# Patient Record
Sex: Female | Born: 1980
Health system: Southern US, Community
[De-identification: ages and names within clinical notes are randomized; demographics above are authoritative.]

## PROBLEM LIST (undated history)

## (undated) DIAGNOSIS — K219 Gastro-esophageal reflux disease without esophagitis: Secondary | ICD-10-CM

## (undated) DIAGNOSIS — F329 Major depressive disorder, single episode, unspecified: Secondary | ICD-10-CM

## (undated) DIAGNOSIS — F32A Depression, unspecified: Secondary | ICD-10-CM

## (undated) DIAGNOSIS — F419 Anxiety disorder, unspecified: Secondary | ICD-10-CM

## (undated) DIAGNOSIS — D6852 Prothrombin gene mutation: Secondary | ICD-10-CM

## (undated) DIAGNOSIS — K8020 Calculus of gallbladder without cholecystitis without obstruction: Secondary | ICD-10-CM

## (undated) DIAGNOSIS — D682 Hereditary deficiency of other clotting factors: Secondary | ICD-10-CM

## (undated) DIAGNOSIS — Z7901 Long term (current) use of anticoagulants: Secondary | ICD-10-CM

## (undated) HISTORY — DX: Calculus of gallbladder without cholecystitis without obstruction: K80.20

## (undated) HISTORY — DX: Anxiety disorder, unspecified: F41.9

## (undated) HISTORY — DX: Depression, unspecified: F32.A

---
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
## 2009-05-08 HISTORY — PX: LAPAROSCOPIC GASTRIC BANDING: SHX1100

## 2012-05-08 HISTORY — PX: OTHER SURGICAL HISTORY: SHX169

## 2017-05-08 HISTORY — PX: OTHER SURGICAL HISTORY: SHX169

## 2018-05-08 NOTE — L&D Delivery Note (Addendum)
Delivery Note At  a non-viable female infant was delivered over intact perineum via svd (Presentation: cephalic, en call  ).  APGAR: 0,0 ; weight not yet done  .   Placenta status: delivered en call, grossly wnl.  Cord: appeared wnl,  with the following complications: none. Fetus and placenta examined and no gross abnormalities noted.  No cord accident appreciated.  Anesthesia:  epidural Episiotomy:  none Lacerations:  none Suture Repair: n/a Est. Blood Loss (mL):  179ml  Mom to postpartum.  Patient would like autopsy, baby currently in the room.  Microarray testing also requested and cord sample cut.  Patient has h/o pp depression and we will start zoloft - has been on in past and has done well.  Patient is requesting pastoral care at this time. Husband and patient coping well.  Charyl Bigger 10/21/2018, 7:08 AM

## 2018-07-17 ENCOUNTER — Other Ambulatory Visit: Payer: Self-pay | Admitting: Surgery

## 2018-07-26 ENCOUNTER — Encounter (HOSPITAL_COMMUNITY): Payer: Self-pay | Admitting: *Deleted

## 2018-07-26 NOTE — Patient Instructions (Addendum)
Cindy Krueger  07/26/2018       Your procedure is scheduled on:  07-30-2018   Report to [facility] Main  Entrance,  Report to Short Stay at  5:30 AM    Call this number if you have problems the morning of surgery [phone]        Remember: Do not eat food or drink liquids :After Midnight.  This includes no water, candy, gum, mints  BRUSH YOUR TEETH MORNING OF SURGERY AND RINSE YOUR MOUTH OUT         Take these medicines the morning of surgery with A SIP OF WATER:  NONE                                    You may not have any metal on your body including hair pins and piercings              Do not wear jewelry, make-up, lotions, powders or perfumes, deodorant                          Men may shave face and neck.       Do not bring valuables to the hospital. [facility] IS NOT             RESPONSIBLE   FOR VALUABLES.  Contacts, dentures or bridgework may not be worn into surgery.          Patients discharged the day of surgery will not be allowed to drive home. IF YOU ARE HAVING SURGERY AND GOING HOME THE SAME DAY, YOU MUST HAVE AN ADULT TO DRIVE YOU HOME AND BE WITH YOU FOR 24 HOURS. YOU MAY GO HOME BY TAXI OR UBER OR ORTHERWISE, BUT AN ADULT MUST ACCOMPANY YOU HOME AND STAY WITH YOU FOR 24 HOURS.   Name and phone number of your driver:         _____________________________________________________________________             [facility] - Preparing for Surgery Before surgery, you can play an important role.  Because skin is not sterile, your skin needs to be as free of germs as possible.  You can reduce the number of germs on your skin by washing with CHG (chlorahexidine gluconate) soap before surgery.  CHG is an antiseptic cleaner which kills germs and bonds with the skin to continue killing germs even after washing. Please DO NOT use if you have an allergy to CHG or antibacterial soaps.  If your skin becomes  reddened/irritated stop using the CHG and inform your nurse when you arrive at Short Stay. Do not shave (including legs and underarms) for at least 48 hours prior to the first CHG shower.  You may shave your face/neck. Please follow these instructions carefully:  1.  Shower with CHG Soap the night before surgery and the  morning of Surgery.  2.  If you choose to wash your hair, wash your hair first as usual with your  normal  shampoo.  3.  After you shampoo, rinse your hair and body thoroughly to remove the  shampoo.                            4.  Use CHG as you  would any other liquid soap.  You can apply chg directly  to the skin and wash                       Gently with a scrungie or clean washcloth.  5.  Apply the CHG Soap to your body ONLY FROM THE NECK DOWN.   Do not use on face/ open                           Wound or open sores. Avoid contact with eyes, ears mouth and genitals (private parts).                       Wash face,  Genitals (private parts) with your normal soap.             6.  Wash thoroughly, paying special attention to the area where your surgery  will be performed.  7.  Thoroughly rinse your body with warm water from the neck down.  8.  DO NOT shower/wash with your normal soap after using and rinsing off  the CHG Soap.             9.  Pat yourself dry with a clean towel.            10.  Wear clean pajamas.            11.  Place clean sheets on your bed the night of your first shower and do not  sleep with pets. Day of Surgery : Do not apply any lotions/deodorants the morning of surgery.  Please wear clean clothes to the hospital/surgery center.  FAILURE TO FOLLOW THESE INSTRUCTIONS MAY RESULT IN THE CANCELLATION OF YOUR SURGERY PATIENT SIGNATURE_________________________________  NURSE SIGNATURE__________________________________  ________________________________________________________________________

## 2018-07-29 ENCOUNTER — Encounter (HOSPITAL_COMMUNITY)
Admission: RE | Admit: 2018-07-29 | Discharge: 2018-07-29 | Disposition: A | Discharge status: Home / Self Care | Payer: 59 | Source: Ambulatory Visit | Attending: Surgery | Admitting: Surgery

## 2018-07-29 ENCOUNTER — Other Ambulatory Visit: Payer: Self-pay

## 2018-07-29 ENCOUNTER — Encounter (HOSPITAL_COMMUNITY): Payer: Self-pay

## 2018-07-29 ENCOUNTER — Encounter (INDEPENDENT_AMBULATORY_CARE_PROVIDER_SITE_OTHER): Payer: Self-pay

## 2018-07-29 DIAGNOSIS — Z9884 Bariatric surgery status: Secondary | ICD-10-CM | POA: Diagnosis not present

## 2018-07-29 DIAGNOSIS — K9501 Infection due to gastric band procedure: Secondary | ICD-10-CM | POA: Insufficient documentation

## 2018-07-29 DIAGNOSIS — O26892 Other specified pregnancy related conditions, second trimester: Secondary | ICD-10-CM | POA: Diagnosis not present

## 2018-07-29 DIAGNOSIS — Z01812 Encounter for preprocedural laboratory examination: Secondary | ICD-10-CM | POA: Insufficient documentation

## 2018-07-29 DIAGNOSIS — Z7901 Long term (current) use of anticoagulants: Secondary | ICD-10-CM | POA: Diagnosis not present

## 2018-07-29 DIAGNOSIS — D6852 Prothrombin gene mutation: Secondary | ICD-10-CM | POA: Diagnosis not present

## 2018-07-29 DIAGNOSIS — Z87891 Personal history of nicotine dependence: Secondary | ICD-10-CM | POA: Diagnosis not present

## 2018-07-29 DIAGNOSIS — Z3A14 14 weeks gestation of pregnancy: Secondary | ICD-10-CM | POA: Diagnosis not present

## 2018-07-29 HISTORY — DX: Long term (current) use of anticoagulants: Z79.01

## 2018-07-29 HISTORY — DX: Gastro-esophageal reflux disease without esophagitis: K21.9

## 2018-07-29 HISTORY — DX: Hereditary deficiency of other clotting factors: D68.2

## 2018-07-29 HISTORY — DX: Prothrombin gene mutation: D68.52

## 2018-07-29 LAB — CBC
HCT: 36.2 % (ref 36.0–46.0)
Hemoglobin: 11.9 g/dL — ABNORMAL LOW (ref 12.0–15.0)
MCH: 31 pg (ref 26.0–34.0)
MCHC: 32.9 g/dL (ref 30.0–36.0)
MCV: 94.3 fL (ref 80.0–100.0)
Platelets: 223 10*3/uL (ref 150–400)
RBC: 3.84 MIL/uL — ABNORMAL LOW (ref 3.87–5.11)
RDW: 12.4 % (ref 11.5–15.5)
WBC: 9.8 10*3/uL (ref 4.0–10.5)
nRBC: 0 % (ref 0.0–0.2)

## 2018-07-29 LAB — BASIC METABOLIC PANEL
Anion gap: 11 (ref 5–15)
BUN: 14 mg/dL (ref 6–20)
CO2: 22 mmol/L (ref 22–32)
Calcium: 9.3 mg/dL (ref 8.9–10.3)
Chloride: 102 mmol/L (ref 98–111)
Creatinine, Ser: 0.64 mg/dL (ref 0.44–1.00)
GFR calc Af Amer: >60 mL/min (ref >60)
GFR calc non Af Amer: >60 mL/min (ref >60)
GLUCOSE: 93 mg/dL (ref 70–99)
Potassium: 3.9 mmol/L (ref 3.5–5.1)
Sodium: 135 mmol/L (ref 135–145)

## 2018-07-29 LAB — PROTIME-INR
INR: 1 (ref 0.8–1.2)
PROTHROMBIN TIME: 12.7 s (ref 11.4–15.2)

## 2018-07-29 LAB — APTT: aPTT: 32 seconds (ref 24–36)

## 2018-07-29 MED ORDER — CHLORHEXIDINE GLUCONATE CLOTH 2 % EX PADS
6.0000 | MEDICATED_PAD | Freq: Once | CUTANEOUS | Status: DC
  Filled 2018-07-29: qty 6

## 2018-07-29 NOTE — Progress Notes (Signed)
Chart to anesthesia for review, Jessica Zanetto PA. 

## 2018-07-29 NOTE — Anesthesia Preprocedure Evaluation (Addendum)
Anesthesia Evaluation  Patient identified by MRN, date of birth, ID band Patient awake    Reviewed: Allergy & Precautions, NPO status , Patient's Chart, lab work & pertinent test results  History of Anesthesia Complications Negative for: history of anesthetic complications  Airway Mallampati: II  TM Distance: >3 FB Neck ROM: Full    Dental  (+) Dental Advisory Given   Pulmonary former smoker (quit 1999),    breath sounds clear to auscultation       Cardiovascular negative cardio ROS   Rhythm:Regular Rate:Normal     Neuro/Psych negative neurological ROS     GI/Hepatic Neg liver ROS, GERD  Poorly Controlled,Malpositioned lap band   Endo/Other  obese  Renal/GU negative Renal ROS     Musculoskeletal   Abdominal (+) + obese,   Peds  Hematology Factor II deficiency: lovenox   Anesthesia Other Findings   Reproductive/Obstetrics (+) Pregnancy (due 01/25/2019 (14 weeks gest))                           Anesthesia Physical Anesthesia Plan  ASA: III  Anesthesia Plan: General   Post-op Pain Management:    Induction: Intravenous  PONV Risk Score and Plan: 3 and Treatment may vary due to age or medical condition, Ondansetron, Dexamethasone and Scopolamine patch - Pre-op  Airway Management Planned: Oral ETT  Additional Equipment:   Intra-op Plan:   Post-operative Plan: Extubation in OR  Informed Consent: I have reviewed the patients History and Physical, chart, labs and discussed the procedure including the risks, benefits and alternatives for the proposed anesthesia with the patient or authorized representative who has indicated his/her understanding and acceptance.     Dental advisory given  Plan Discussed with: CRNA and Surgeon  Anesthesia Plan Comments: (See PAT note 06/30/18, Jodell Cipro, PA-C Plan routine monitors, GETA OB not monitoring since only [redacted] weeks gestation,  non-viable)      Anesthesia Quick Evaluation

## 2018-07-29 NOTE — H&P (Signed)
Cindy Krueger  Location: [facility] Patient #: [id number] DOB: 1980-06-29 Married / Language: English / Race: White Female  History of Present Illness   The patient is a 38 year old female who presents with a complaint of lap band.  The PCP is Dr. Rhoderick Moody [facility])  The patient was referred by Dr. Rhoderick Moody  She comes by herself.  She had a lap band placed in [place] in 2009. Unfortunately, she required an open operation becasue a "blood vessel was cut". About 2012, she was living in [place], developed a SBO, and required another abdominal operation. She did well until 2019, when she was living in [place]. She had to have her port "revised". Dr. Era Bumpers, [facility] [place], did the surgery. She has never used the port. After port was placed - she went form 260 pounds to 170 pounds on her own. She even got to the place that she was doing sprint triathlons. But with her pregnancies, she has gained 40 pounds back. Over the last couple weeks she has had increasing tenderness and redness over her port.  I talked about the differential diagnosis, including port infection. I talked about possible erosion of the band. Talked about the medical and surgical options. For now we will try antibiotics, and see if the redness response. I think we'll schedule an upper endoscopy at some point to rule out band erosion. What to do with the port will depend on how she responds to antibiotics.  Plan: 1) Treat with Keflex for 2 weeks, 2) Possibly remove the port, 3) Consider upper endoscopy, 4) I spoke to French Ana, a nurse that works with Dr. Amado Nash, about the antibiotics. Dr. Amado Nash will call me next week.  Review of Systems as stated in this history (HPI) or in the review of systems. Otherwise all other 12 point ROS are negative  Past Medical History: 1. Lap band - 2009 [facility] Surgery was  done open - and she was so traumatized, she never had the band adjusted 2. SBO - 2012 - [facility] 3. Port revision [facility] - Dr. Era Bumpers She said that he did do one fill, but she never felt any resisitance. 4. PT factor 2 (Prothrombin 96295)- on Lovenox during her pregnancy She does not have a hematologist in [place] She is not on anticoagulation otherwise. She has not had DVT. She has had miscarriages. 5. Pregnant - due 01/25/2019  Social History: Married Has 2 yo daughter. She is pregnant and due 01/25/2019.  Her husband works Data processing manager for [employer]. 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 Medications Reconciled  Social History Cindy Krueger, [place]; 06/28/2018 3:15 PM) Alcohol use  Recently quit alcohol use. Caffeine use  Tea. No drug use   Family History Cindy Krueger, [place]; 06/28/2018 3:15 PM) Arthritis  Mother. Heart Disease  Father, Mother. Hypertension  Mother. Prostate Cancer  Father.  Pregnancy / Birth History Cindy Krueger, [place]; 06/28/2018 3:15 PM) Age at menarche  13 years. Contraceptive History  Oral contraceptives. Cindy Krueger  4 Length (months) of breastfeeding  7-12 Maternal age  15-35 Para  1  Other Problems Cindy Krueger, [place]; 06/28/2018 3:15 PM) Other disease, cancer, significant illness   Vitals Cindy Krueger CMA; 06/28/2018 3:17 PM) 06/28/2018 3:17 PM Weight: 215 lb Height:  67in Body Surface Area: 2.09 m Body Mass Index: 33.67 kg/m  Temp.: 98.69F  Pulse: 85 (Regular)  BP: 114/74 (Sitting, Left Arm, Standard)   Physical Exam   General: WN WF who is alert and generally healthy appearing. HEENT: Normal. Pupils equal.  Neck: Supple. No mass. No thyroid mass. Lymph Nodes: No supraclavicular or cervical nodes.  Lungs: Clear to auscultation and symmetric breath sounds. Heart: RRR. No murmur or rub.  Abdomen: Soft. No hernia. Normal bowel sounds.  She has a transverse incision in her upper abdomen, which is towards the left upper abdomen. Her port is in the midline, is at the skin, but has redness and tenderness. She has another incision above this that is transverse and maybe 7 cm. Rectal: Not done.  Extremities: Good strength and ROM in upper and lower extremities.  Neurologic: Grossly intact to motor and sensory function. Psychiatric: Has normal mood and affect. Behavior is normal.   Assessment & Plan  1.  HISTORY OF LAPAROSCOPIC ADJUSTABLE GASTRIC BANDING (Z98.84)  A.  Lap band - 2009 [facility]   Surgery was done open - and she was so traumatized, she never had the band adjusted  B.  SBO - 2012 - [facility]  C.  Port revision [facility] - Dr. Era Bumpers   She said that he did do one fill, but she never felt any resisitance.   Plan:  1)   This did not work  2) Remove the port  3) Upper endoscopy to evaluate possible erosion  2.  PREGNANT (Z34.90)  due 01/25/2019 3.  PROTHROMBIN G20210A MUTATION (Z61.09)  She does not have a hematologist in [place]  She has had miscarriages.  Impression: She is on Lovenox.  Addendum Note(Cindy Krueger H. Ezzard Standing MD; 07/03/2018 1:57 PM)  Talked on the phone to Dr. Martha Krueger. I explained lap bands and their risks. She is going to see Cindy Krueger on 3/10. Before any surgery or endo - she would prefer that Cindy Krueger is out of her 1st trimester.  So we could do something after 3/16, unless it  becomes surgically indicated before.  Addendum Note(Cindy Krueger H. Ezzard Standing MD; 07/17/2018 9:13 AM)  Talked to patient. The port got better briefly on antibiotics, but then got worse. She is ready to have port removed.  We talked about the options.  Plan upper endo and port removal at the same time. I don't need to see her in the office before the surgery.  Ovidio Kin, MD, [facility] Pager: [phone] Office phone:  [phone]

## 2018-07-29 NOTE — Progress Notes (Signed)
Anesthesia Chart Review   Case:  [id number] Date/Time:  07/30/18 0700   Procedure:  LAPAROSCOPIC REMOVAL OF Lap Band Port only, Upper Endo (N/A )   Anesthesia type:  General   Pre-op diagnosis:      PT factor 2  Prothrombin 60677 on Lovenox during her pregnancy,  H/O Lap Band Placement,     Infected and malpositioned Lap Band Port, Pregnant   Location:  [facility] ROOM 02 / [facility]   Surgeon:  Ovidio Kin, MD      DISCUSSION: 38 yo former smoker (quit 07/28/97) with h/o Factor II deficiency (on Lovenox during pregnancy), GERD, pregnant with EDD 01/25/19, h/o lab band placement with infection and malpositioned lap band port scheduled for above procedure 07/29/18 with Dr. Ovidio Kin.   Pt can proceed with planned procedure barring acute status change.  VS: BP 123/70 (BP Location: Right Arm)   Pulse 60   Temp 36.7 C (Oral)   Resp 18   Ht 5\' 7"  (1.702 m)   Wt 97.3 kg   LMP 04/20/2018 (Exact Date)   SpO2 100%   BMI 33.60 kg/m   PROVIDERS: Patient, No Pcp Per  Rhoderick Moody, MD is OBGYN  LABS: Labs reviewed: Acceptable for surgery. (all labs ordered are listed, but only abnormal results are displayed)  Labs Reviewed  CBC - Abnormal; Notable for the following components:      Result Value   RBC 3.84 (*)    Hemoglobin 11.9 (*)    All other components within normal limits  PROTIME-INR  APTT  BASIC METABOLIC PANEL     IMAGES:   EKG:   CV:  Past Medical History:  Diagnosis Date  . Anticoagulant long-term use    lovenox during pregnancy  . 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 GERD (gastroesophageal reflux disease)   . Prothrombin gene mutation [facility])     Past Surgical History:  Procedure Laterality Date  . EXPLORATORY LAPAROTOMY LYSIS ADHESIONS FOR BOWEL OBSTRUCTION  2014  . LAPAROSCOPIC GASTRIC BANDING  2011  . LAPAROSCOPY CONVERTED TO OPEN GASTRIC BAND PORT REPOSITION  2019    MEDICATIONS: . calcium carbonate (TUMS - DOSED IN MG  ELEMENTAL CALCIUM) 500 MG chewable tablet  . enoxaparin (LOVENOX) 40 MG/0.4ML injection  . Prenatal Vit-Fe Fumarate-FA (PRENATAL MULTIVITAMIN) TABS tablet   . Chlorhexidine Gluconate Cloth 2 % PADS 6 each   And  . Chlorhexidine Gluconate Cloth 2 % PADS 6 each   Janey Genta [facility] Pre-Surgical Testing [phone] 07/29/18 11:48 AM

## 2018-07-30 ENCOUNTER — Ambulatory Visit (HOSPITAL_COMMUNITY): Payer: 59 | Admitting: Physician Assistant

## 2018-07-30 ENCOUNTER — Encounter (HOSPITAL_COMMUNITY): Payer: Self-pay | Admitting: *Deleted

## 2018-07-30 ENCOUNTER — Ambulatory Visit (HOSPITAL_COMMUNITY)
Admission: RE | Admit: 2018-07-30 | Discharge: 2018-07-30 | Disposition: A | Discharge status: Home / Self Care | Payer: 59 | Attending: Surgery | Admitting: Surgery

## 2018-07-30 ENCOUNTER — Encounter (HOSPITAL_COMMUNITY)
Admission: RE | Disposition: A | Discharge status: Home / Self Care | Payer: Self-pay | Source: Home / Self Care | Attending: Surgery

## 2018-07-30 DIAGNOSIS — O26892 Other specified pregnancy related conditions, second trimester: Secondary | ICD-10-CM | POA: Insufficient documentation

## 2018-07-30 DIAGNOSIS — Z9884 Bariatric surgery status: Secondary | ICD-10-CM | POA: Insufficient documentation

## 2018-07-30 DIAGNOSIS — K9501 Infection due to gastric band procedure: Secondary | ICD-10-CM | POA: Insufficient documentation

## 2018-07-30 DIAGNOSIS — D6852 Prothrombin gene mutation: Secondary | ICD-10-CM | POA: Insufficient documentation

## 2018-07-30 DIAGNOSIS — Z87891 Personal history of nicotine dependence: Secondary | ICD-10-CM | POA: Insufficient documentation

## 2018-07-30 DIAGNOSIS — Z3A14 14 weeks gestation of pregnancy: Secondary | ICD-10-CM | POA: Insufficient documentation

## 2018-07-30 DIAGNOSIS — Z7901 Long term (current) use of anticoagulants: Secondary | ICD-10-CM | POA: Insufficient documentation

## 2018-07-30 SURGERY — REMOVAL, GASTRIC BAND, LAPAROSCOPIC
Anesthesia: General | Site: Abdomen

## 2018-07-30 MED ORDER — ONDANSETRON HCL 4 MG/2ML IJ SOLN
INTRAMUSCULAR | Status: DC | PRN
  Administered 2018-07-30: 4 mg via INTRAVENOUS

## 2018-07-30 MED ORDER — SUCCINYLCHOLINE CHLORIDE 20 MG/ML IJ SOLN
INTRAMUSCULAR | Status: DC | PRN
  Administered 2018-07-30: 140 mg via INTRAVENOUS

## 2018-07-30 MED ORDER — BUPIVACAINE-EPINEPHRINE (PF) 0.25% -1:200000 IJ SOLN
INTRAMUSCULAR | Status: AC
  Filled 2018-07-30: qty 30

## 2018-07-30 MED ORDER — PROMETHAZINE HCL 25 MG/ML IJ SOLN: 6.2500 mg | INTRAMUSCULAR | Status: DC | PRN

## 2018-07-30 MED ORDER — BUPIVACAINE-EPINEPHRINE 0.5% -1:200000 IJ SOLN
INTRAMUSCULAR | Status: DC | PRN
  Administered 2018-07-30: 30 mL

## 2018-07-30 MED ORDER — ONDANSETRON HCL 4 MG/2ML IJ SOLN
INTRAMUSCULAR | Status: AC
  Filled 2018-07-30: qty 2

## 2018-07-30 MED ORDER — MIDAZOLAM HCL 2 MG/2ML IJ SOLN: 0.5000 mg | Freq: Once | INTRAMUSCULAR | Status: DC | PRN

## 2018-07-30 MED ORDER — 0.9 % SODIUM CHLORIDE (POUR BTL) OPTIME
TOPICAL | Status: DC | PRN
  Administered 2018-07-30: 1000 mL

## 2018-07-30 MED ORDER — SCOPOLAMINE 1 MG/3DAYS TD PT72
MEDICATED_PATCH | TRANSDERMAL | Status: AC
  Filled 2018-07-30: qty 1

## 2018-07-30 MED ORDER — PROPOFOL 10 MG/ML IV BOLUS
INTRAVENOUS | Status: DC | PRN
  Administered 2018-07-30: 130 mg via INTRAVENOUS

## 2018-07-30 MED ORDER — MEPERIDINE HCL 50 MG/ML IJ SOLN: 6.2500 mg | INTRAMUSCULAR | Status: DC | PRN

## 2018-07-30 MED ORDER — DEXAMETHASONE SODIUM PHOSPHATE 10 MG/ML IJ SOLN
INTRAMUSCULAR | Status: DC | PRN
  Administered 2018-07-30: 4 mg via INTRAVENOUS

## 2018-07-30 MED ORDER — HYDROMORPHONE HCL 1 MG/ML IJ SOLN: 0.2500 mg | INTRAMUSCULAR | Status: DC | PRN

## 2018-07-30 MED ORDER — TRAMADOL HCL 50 MG PO TABS: 50.0000 mg | ORAL_TABLET | Freq: Three times a day (TID) | ORAL | 1 refills | Status: DC | PRN

## 2018-07-30 MED ORDER — FENTANYL CITRATE (PF) 250 MCG/5ML IJ SOLN
INTRAMUSCULAR | Status: AC
  Filled 2018-07-30: qty 5

## 2018-07-30 MED ORDER — FENTANYL CITRATE (PF) 100 MCG/2ML IJ SOLN
INTRAMUSCULAR | Status: DC | PRN
  Administered 2018-07-30: 50 ug via INTRAVENOUS
  Administered 2018-07-30: 100 ug via INTRAVENOUS
  Administered 2018-07-30: 50 ug via INTRAVENOUS

## 2018-07-30 MED ORDER — PROPOFOL 10 MG/ML IV BOLUS
INTRAVENOUS | Status: AC
  Filled 2018-07-30: qty 20

## 2018-07-30 MED ORDER — CEFAZOLIN SODIUM-DEXTROSE 2-4 GM/100ML-% IV SOLN
2.0000 g | INTRAVENOUS | Status: AC
  Administered 2018-07-30: 2 g via INTRAVENOUS
  Filled 2018-07-30: qty 100

## 2018-07-30 MED ORDER — LACTATED RINGERS IV SOLN
INTRAVENOUS | Status: DC
  Administered 2018-07-30 (×2): via INTRAVENOUS

## 2018-07-30 MED ORDER — MIDAZOLAM HCL 2 MG/2ML IJ SOLN
INTRAMUSCULAR | Status: AC
  Filled 2018-07-30: qty 2

## 2018-07-30 SURGICAL SUPPLY — 53 items
BLADE HEX COATED 2.75 (ELECTRODE) ×3 IMPLANT
BLADE SURG 15 STRL LF DISP TIS (BLADE) IMPLANT
BLADE SURG 15 STRL SS (BLADE)
BLADE SURG SZ11 CARB STEEL (BLADE) ×3 IMPLANT
CABLE HIGH FREQUENCY MONO STRZ (ELECTRODE) ×3 IMPLANT
COVER WAND RF STERILE (DRAPES) IMPLANT
DECANTER SPIKE VIAL GLASS SM (MISCELLANEOUS) ×6 IMPLANT
DERMABOND ADVANCED (GAUZE/BANDAGES/DRESSINGS) ×2
DERMABOND ADVANCED .7 DNX12 (GAUZE/BANDAGES/DRESSINGS) ×1 IMPLANT
DEVICE SUT QUICK LOAD TK 5 (STAPLE) ×6 IMPLANT
DEVICE SUT TI-KNOT TK 5X26 (MISCELLANEOUS) ×2 IMPLANT
DEVICE SUTURE ENDOST 10MM (ENDOMECHANICALS) IMPLANT
DEVICE TI KNOT TK5 (MISCELLANEOUS) ×1
ELECT PENCIL ROCKER SW 15FT (MISCELLANEOUS) ×3 IMPLANT
ELECT REM PT RETURN 15FT ADLT (MISCELLANEOUS) ×3 IMPLANT
GAUZE SPONGE 4X4 12PLY STRL (GAUZE/BANDAGES/DRESSINGS) ×3 IMPLANT
GLOVE BIOGEL PI IND STRL 7.0 (GLOVE) ×1 IMPLANT
GLOVE BIOGEL PI INDICATOR 7.0 (GLOVE) ×2
GOWN SPEC L4 XLG W/TWL (GOWN DISPOSABLE) ×3 IMPLANT
GOWN STRL REUS W/ TWL XL LVL3 (GOWN DISPOSABLE) ×3 IMPLANT
GOWN STRL REUS W/TWL LRG LVL3 (GOWN DISPOSABLE) ×3 IMPLANT
GOWN STRL REUS W/TWL XL LVL3 (GOWN DISPOSABLE) ×6
HOVERMATT SINGLE USE (MISCELLANEOUS) ×3 IMPLANT
KIT BASIN OR (CUSTOM PROCEDURE TRAY) ×3 IMPLANT
KIT TURNOVER KIT A (KITS) IMPLANT
NEEDLE SPNL 22GX3.5 QUINCKE BK (NEEDLE) ×3 IMPLANT
NS IRRIG 1000ML POUR BTL (IV SOLUTION) ×3 IMPLANT
PACK UNIVERSAL I (CUSTOM PROCEDURE TRAY) ×3 IMPLANT
QUICK LOAD TK 5 (STAPLE) ×3
SET IRRIG TUBING LAPAROSCOPIC (IRRIGATION / IRRIGATOR) IMPLANT
SET TUBE SMOKE EVAC HIGH FLOW (TUBING) ×3 IMPLANT
SHEARS HARMONIC ACE PLUS 36CM (ENDOMECHANICALS) IMPLANT
SLEEVE ADV FIXATION 5X100MM (TROCAR) ×3 IMPLANT
SOLUTION ANTI FOG 6CC (MISCELLANEOUS) ×3 IMPLANT
SPONGE LAP 18X18 RF (DISPOSABLE) ×3 IMPLANT
STAPLER VISISTAT 35W (STAPLE) ×3 IMPLANT
SUT ETHIBOND 2 0 SH (SUTURE) ×6
SUT ETHIBOND 2 0 SH 36X2 (SUTURE) ×3 IMPLANT
SUT MNCRL AB 4-0 PS2 18 (SUTURE) ×3 IMPLANT
SUT PROLENE 2 0 CT2 30 (SUTURE) ×3 IMPLANT
SUT SILK 0 (SUTURE) ×2
SUT SILK 0 30XBRD TIE 6 (SUTURE) ×1 IMPLANT
SUT SURGIDAC NAB ES-9 0 48 120 (SUTURE) IMPLANT
SUT VIC AB 2-0 SH 27 (SUTURE) ×2
SUT VIC AB 2-0 SH 27X BRD (SUTURE) ×1 IMPLANT
SYR 20CC LL (SYRINGE) ×3 IMPLANT
SYR CONTROL 10ML LL (SYRINGE) ×3 IMPLANT
TAPE CLOTH SURG 4X10 WHT LF (GAUZE/BANDAGES/DRESSINGS) ×3 IMPLANT
TOWEL OR 17X26 10 PK STRL BLUE (TOWEL DISPOSABLE) ×3 IMPLANT
TROCAR ADV FIXATION 11X100MM (TROCAR) IMPLANT
TROCAR BLADELESS OPT 5 100 (ENDOMECHANICALS) ×6 IMPLANT
TROCAR XCEL NON-BLD 11X100MML (ENDOMECHANICALS) IMPLANT
TUBE CALIBRATION LAPBAND (TUBING) ×3 IMPLANT

## 2018-07-30 NOTE — Op Note (Addendum)
07/30/2018  8:14 AM  PATIENT:  Cindy Krueger, 38 y.o., female, MRN: [id number]  PREOP DIAGNOSIS:  PT factor 2  Prothrombin 13086 on Lovenox during her pregnancy,  H/O Lap Band Placement, Infected and malpositioned Lap Band Port, Pregnant  POSTOP DIAGNOSIS:   Lap band port infection/irritation, Normal lap band anatomy on endoscopy  PROCEDURE:   Procedure(s): REMOVAL OF Lap Band Port only, Upper Endoscopy  SURGEON:   Ovidio Kin, M.D.  ASSISTANT:   None  ANESTHESIA:   general  Anesthesiologist: Jairo Ben, MD CRNA: Kizzie Fantasia, CRNA; Nelle Don, CRNA  General  EBL:  minimal  ml  BLOOD ADMINISTERED: none  DRAINS: none   LOCAL MEDICATIONS USED:   30 cc 1/4% marcaine  SPECIMEN:   None  COUNTS CORRECT:  YES  INDICATIONS FOR PROCEDURE:  [name] is a 38 y.o. (DOB: 21-Feb-1981) white female whose primary care physician is Patient, No Pcp Per and comes for upper endoscopy and lap band port removal.   The patient had a lap band placed in 2009 in [place].  She has had trouble with the band in the past.  She required a port revision in 2019 in [place].  She has had redness and inflammation around the port of the LAP-BAND.  She comes for port removal and upper endoscopy.   The indications and risks of the surgery were explained to the patient.  The risks include, but are not limited to, infection, bleeding, and nerve injury.  PROCEDURE: The patient was taken to room OR #2 at [facility].  She underwent a general anesthesia.  A timeout was held the surgical checklist run.  I first did the upper endoscopy.  I passed an Olympus endoscope into her stomach without difficulty.  I advanced the scope to the pylorus and duodenum.  These were unremarkable.  The body of the stomach was unremarkable.  I retroflexed the scope and visualize the LAP-BAND imprint in the proximal stomach and there was no evidence of erosion.  I withdrew the scope and the orifice  of the LAP-BAND was about 40 cm from her incisors.  Her esophagogastric junction was about 37 cm.  Her esophagus was unremarkable.  This was considered normal endoscopy for a patient that has a LAP-BAND.  I then prepped her abdomen with ChloraPrep.  Her abdomen sterilely dressed.  I infiltrated 30 cc of 1/4% marcaine around the port.  I made a 3 cm incision over the LAP-BAND port.  I obtained cultures of the port pocket.  The port was not grossly infected.  I then removed the port by cutting the suture holding the port.  The tubing to the LAP-BAND was cut and allowed to retract back into the peritoneal cavity.  I then irrigated the wound with 300 cc of saline.  I packed the wound with a sterile saline gauze.  And the wound was sterilely dressed.   The patient was transported to recovery in good condition.    Endoscopy of lap band imprint - no erosion  Ovidio Kin, MD, [facility] Pager: [phone] Office phone:  [phone]

## 2018-07-30 NOTE — Interval H&P Note (Signed)
History and Physical Interval Note:  07/30/2018 7:33 AM  Cindy Krueger  has presented today for surgery, with the diagnosis of PT factor 2  Prothrombin 36681 on Lovenox during her pregnancy,  H/O Lap Band Placement, Infected and malpositioned Lap Band Port, Pregnant.   Husband at home.  Corona virus days.  The various methods of treatment have been discussed with the patient and family. After consideration of risks, benefits and other options for treatment, the patient has consented to  Procedure(s): LAPAROSCOPIC REMOVAL OF Lap Band Port only, Upper Endo (N/A) as a surgical intervention.  The patient's history has been reviewed, patient examined, no change in status, stable for surgery.  I have reviewed the patient's chart and labs.  Questions were answered to the patient's satisfaction.     [name]

## 2018-07-30 NOTE — Transfer of Care (Signed)
Immediate Anesthesia Transfer of Care Note  Patient: Theme park manager  Procedure(s) Performed: LAPAROSCOPIC REMOVAL OF Lap Band Port only, Upper Endo (N/A )  Patient Location: PACU  Anesthesia Type:General  Level of Consciousness: awake, alert  and oriented  Airway & Oxygen Therapy: Patient Spontanous Breathing and Patient connected to face mask oxygen  Post-op Assessment: Report given to RN and Post -op Vital signs reviewed and stable  Post vital signs: Reviewed and stable  Last Vitals:  Vitals Value Taken Time  BP 134/76 07/30/2018  8:19 AM  Temp 36.5 C 07/30/2018  8:19 AM  Pulse 90 07/30/2018  8:20 AM  Resp 19 07/30/2018  8:20 AM  SpO2 97 % 07/30/2018  8:20 AM  Vitals shown include unvalidated device data.  Last Pain:  Vitals:   07/30/18 0536  TempSrc: Oral         Complications: No apparent anesthesia complications

## 2018-07-30 NOTE — Discharge Instructions (Addendum)
[facility] - DISCHARGE INSTRUCTIONS TO PATIENT  Activity:  Driving - May drive tomorrow, if off pain meds   Lifting - No limit  Wound Care:   Start dressing changes tomorrow.  Pack gauze that is damp with saline in the wound.  Change the wound twice a day.  Remove all the gauze and shower at least once a day.  You may get soap and water in the wound.  Diet:  As tolerated.  Follow up appointment:  Call Dr. Allene Pyo office [facility]) at [phone] for an appointment in 2 weeks.       We may try to do an "E" visit.  My nurse April will coordinate this.  Medications and dosages:  Resume your home medications.  You have a prescription for:  Ultram             You should restart the Lovenox tomorrow.  Call Dr. Ezzard Standing or his office  [phone]) if you have:  Temperature greater than 100.4,  Redness, tenderness, or signs of infection (pain, swelling, redness, odor or green/yellow discharge around the site),  Any other questions or concerns you may have after discharge.  In an emergency, call 911 or go to an Emergency Department at a nearby hospital.

## 2018-07-30 NOTE — Anesthesia Procedure Notes (Signed)
Procedure Name: Intubation Performed by: Gean Maidens, CRNA Pre-anesthesia Checklist: Patient identified, Emergency Drugs available, Suction available and Timeout performed Patient Re-evaluated:Patient Re-evaluated prior to induction Oxygen Delivery Method: Circle system utilized Preoxygenation: Pre-oxygenation with 100% oxygen Induction Type: IV induction Ventilation: Mask ventilation without difficulty Laryngoscope Size: Mac and 4 Grade View: Grade I Tube type: Oral Tube size: 7.0 mm Number of attempts: 1 Airway Equipment and Method: Stylet Placement Confirmation: ETT inserted through vocal cords under direct vision,  positive ETCO2 and breath sounds checked- equal and bilateral Secured at: 21 cm Tube secured with: Tape Dental Injury: Teeth and Oropharynx as per pre-operative assessment

## 2018-07-30 NOTE — Anesthesia Postprocedure Evaluation (Signed)
Anesthesia Post Note  Patient: Theme park manager  Procedure(s) Performed: REMOVAL OF Lap Band Port only, Upper Endo (N/A Abdomen)     Patient location during evaluation: PACU Anesthesia Type: General Level of consciousness: sedated, oriented and patient cooperative Pain management: pain level controlled Vital Signs Assessment: post-procedure vital signs reviewed and stable Respiratory status: spontaneous breathing, nonlabored ventilation and respiratory function stable Cardiovascular status: blood pressure returned to baseline and stable Postop Assessment: no apparent nausea or vomiting Anesthetic complications: no    Last Vitals:  Vitals:   07/30/18 0900 07/30/18 0910  BP: 105/64 115/61  Pulse: (!) 58 72  Resp: 12 12  Temp:  36.6 C  SpO2: 98% 99%    Last Pain:  Vitals:   07/30/18 0900  TempSrc:   PainSc: Asleep                 Niko Penson,E. Luc Shammas

## 2018-07-30 NOTE — Progress Notes (Signed)
This nurse made contact with [facility] Nurse. No orders for doppler from Dr. Amado Nash. Also,  patient is less than 20 weeks and no standing orders for doppler due to viability. If doppler needed after surgery, contact [facility], [phone].

## 2018-08-04 LAB — AEROBIC/ANAEROBIC CULTURE W GRAM STAIN (SURGICAL/DEEP WOUND)
Culture: NO GROWTH
Gram Stain: NONE SEEN

## 2018-10-07 ENCOUNTER — Other Ambulatory Visit: Payer: Self-pay | Admitting: Obstetrics and Gynecology

## 2018-10-07 DIAGNOSIS — O26892 Other specified pregnancy related conditions, second trimester: Secondary | ICD-10-CM

## 2018-10-07 DIAGNOSIS — R109 Unspecified abdominal pain: Secondary | ICD-10-CM

## 2018-10-11 ENCOUNTER — Other Ambulatory Visit: Payer: 59

## 2018-10-20 ENCOUNTER — Inpatient Hospital Stay (HOSPITAL_COMMUNITY)
Admission: AD | Admit: 2018-10-20 | Discharge: 2018-10-21 | DRG: 806 | Disposition: A | Discharge status: Home / Self Care | Payer: 59 | Attending: Obstetrics and Gynecology | Admitting: Obstetrics and Gynecology

## 2018-10-20 ENCOUNTER — Other Ambulatory Visit: Payer: Self-pay

## 2018-10-20 ENCOUNTER — Encounter (HOSPITAL_COMMUNITY): Payer: Self-pay | Admitting: Emergency Medicine

## 2018-10-20 DIAGNOSIS — O99344 Other mental disorders complicating childbirth: Secondary | ICD-10-CM | POA: Diagnosis present

## 2018-10-20 DIAGNOSIS — F419 Anxiety disorder, unspecified: Secondary | ICD-10-CM | POA: Diagnosis present

## 2018-10-20 DIAGNOSIS — Z87891 Personal history of nicotine dependence: Secondary | ICD-10-CM

## 2018-10-20 DIAGNOSIS — Z1159 Encounter for screening for other viral diseases: Secondary | ICD-10-CM | POA: Diagnosis not present

## 2018-10-20 DIAGNOSIS — D6852 Prothrombin gene mutation: Secondary | ICD-10-CM | POA: Diagnosis present

## 2018-10-20 DIAGNOSIS — O9962 Diseases of the digestive system complicating childbirth: Secondary | ICD-10-CM | POA: Diagnosis present

## 2018-10-20 DIAGNOSIS — O9912 Other diseases of the blood and blood-forming organs and certain disorders involving the immune mechanism complicating childbirth: Secondary | ICD-10-CM | POA: Diagnosis present

## 2018-10-20 DIAGNOSIS — Z7901 Long term (current) use of anticoagulants: Secondary | ICD-10-CM

## 2018-10-20 DIAGNOSIS — Z3A26 26 weeks gestation of pregnancy: Secondary | ICD-10-CM

## 2018-10-20 DIAGNOSIS — K219 Gastro-esophageal reflux disease without esophagitis: Secondary | ICD-10-CM | POA: Diagnosis present

## 2018-10-20 DIAGNOSIS — O364XX0 Maternal care for intrauterine death, not applicable or unspecified: Principal | ICD-10-CM | POA: Diagnosis present

## 2018-10-20 LAB — COMPREHENSIVE METABOLIC PANEL
ALT: 12 U/L (ref 0–44)
AST: 16 U/L (ref 15–41)
Albumin: 3.2 g/dL — ABNORMAL LOW (ref 3.5–5.0)
Alkaline Phosphatase: 77 U/L (ref 38–126)
Anion gap: 11 (ref 5–15)
BUN: 14 mg/dL (ref 6–20)
CO2: 21 mmol/L — ABNORMAL LOW (ref 22–32)
Calcium: 9.6 mg/dL (ref 8.9–10.3)
Chloride: 105 mmol/L (ref 98–111)
Creatinine, Ser: 1.03 mg/dL — ABNORMAL HIGH (ref 0.44–1.00)
GFR calc Af Amer: >60 mL/min (ref >60)
GFR calc non Af Amer: >60 mL/min (ref >60)
Glucose, Bld: 79 mg/dL (ref 70–99)
Potassium: 4.1 mmol/L (ref 3.5–5.1)
Sodium: 137 mmol/L (ref 135–145)
Total Bilirubin: 0.2 mg/dL — ABNORMAL LOW (ref 0.3–1.2)
Total Protein: 6.6 g/dL (ref 6.5–8.1)

## 2018-10-20 LAB — SAVE SMEAR(SSMR), FOR PROVIDER SLIDE REVIEW

## 2018-10-20 LAB — CBC
HCT: 37.5 % (ref 36.0–46.0)
Hemoglobin: 12.8 g/dL (ref 12.0–15.0)
MCH: 31.1 pg (ref 26.0–34.0)
MCHC: 34.1 g/dL (ref 30.0–36.0)
MCV: 91 fL (ref 80.0–100.0)
Platelets: 225 10*3/uL (ref 150–400)
RBC: 4.12 MIL/uL (ref 3.87–5.11)
RDW: 13.1 % (ref 11.5–15.5)
WBC: 11.4 10*3/uL — ABNORMAL HIGH (ref 4.0–10.5)
nRBC: 0 % (ref 0.0–0.2)

## 2018-10-20 LAB — KLEIHAUER-BETKE STAIN
# Vials RhIg: 1
Fetal Cells %: 0.1 %
Quantitation Fetal Hemoglobin: 5 mL

## 2018-10-20 LAB — SARS CORONAVIRUS 2: SARS Coronavirus 2: NOT DETECTED

## 2018-10-20 LAB — FIBRINOGEN: Fibrinogen: 456 mg/dL (ref 210–475)

## 2018-10-20 LAB — TYPE AND SCREEN
ABO/RH(D): O POS
Antibody Screen: NEGATIVE

## 2018-10-20 LAB — GROUP B STREP BY PCR: Group B strep by PCR: NEGATIVE

## 2018-10-20 LAB — APTT: aPTT: 28 seconds (ref 24–36)

## 2018-10-20 LAB — ABO/RH: ABO/RH(D): O POS

## 2018-10-20 LAB — PROTIME-INR
INR: 1 (ref 0.8–1.2)
Prothrombin Time: 12.9 seconds (ref 11.4–15.2)

## 2018-10-20 MED ORDER — OXYTOCIN BOLUS FROM INFUSION
500.0000 mL | Freq: Once | INTRAVENOUS | Status: AC
  Administered 2018-10-21: 500 mL via INTRAVENOUS

## 2018-10-20 MED ORDER — LIDOCAINE HCL (PF) 1 % IJ SOLN: 30.0000 mL | INTRAMUSCULAR | Status: DC | PRN

## 2018-10-20 MED ORDER — LACTATED RINGERS IV SOLN
INTRAVENOUS | Status: DC
  Administered 2018-10-20: 23:00:00 via INTRAVENOUS

## 2018-10-20 MED ORDER — ONDANSETRON HCL 4 MG/2ML IJ SOLN: 4.0000 mg | Freq: Four times a day (QID) | INTRAMUSCULAR | Status: DC | PRN

## 2018-10-20 MED ORDER — MISOPROSTOL 25 MCG QUARTER TABLET
25.0000 ug | ORAL_TABLET | ORAL | Status: DC | PRN
  Administered 2018-10-20 (×2): 25 ug via VAGINAL
  Filled 2018-10-20 (×3): qty 1

## 2018-10-20 MED ORDER — LACTATED RINGERS IV SOLN: 500.0000 mL | INTRAVENOUS | Status: DC | PRN

## 2018-10-20 MED ORDER — OXYTOCIN 40 UNITS IN NORMAL SALINE INFUSION - SIMPLE MED
2.5000 [IU]/h | INTRAVENOUS | Status: DC
  Administered 2018-10-21: 2.5 [IU]/h via INTRAVENOUS
  Filled 2018-10-20: qty 1000

## 2018-10-20 MED ORDER — ZOLPIDEM TARTRATE 5 MG PO TABS
5.0000 mg | ORAL_TABLET | Freq: Every evening | ORAL | Status: DC | PRN
  Administered 2018-10-20: 5 mg via ORAL
  Filled 2018-10-20: qty 1

## 2018-10-20 MED ORDER — OXYCODONE-ACETAMINOPHEN 5-325 MG PO TABS: 2.0000 | ORAL_TABLET | ORAL | Status: DC | PRN

## 2018-10-20 MED ORDER — SOD CITRATE-CITRIC ACID 500-334 MG/5ML PO SOLN: 30.0000 mL | ORAL | Status: DC | PRN

## 2018-10-20 MED ORDER — OXYCODONE-ACETAMINOPHEN 5-325 MG PO TABS: 1.0000 | ORAL_TABLET | ORAL | Status: DC | PRN

## 2018-10-20 MED ORDER — ACETAMINOPHEN 325 MG PO TABS: 650.0000 mg | ORAL_TABLET | ORAL | Status: DC | PRN

## 2018-10-20 NOTE — H&P (Addendum)
Cindy Krueger is a 38 y.o. [id number] at 7839w1d gestation presents for complaint of IOL d/t IUFD noted 6/12.  [place] and absent cardiac activity.  Patient then seen 6/12 in late afternoon and u/s at office confirmed absent fht, no obvious abnormalities noted. Patient denies any vaginal bleeding, lof, contractions.  Reports absent FM for about 2-3 days before ED visit.  Antepartum course: complicated by: prothrombin gene mutation, on lovenox q day for prophylaxis, ama: nml genetic screening;  l/s lap band port removal early second trimester - no further issues;  obesity, passed early gtt; gerd: [facility] w[facility].  See complete pre-natal records  History OB History    Gravida  4   Para  1   Term  1   Preterm  0   AB  2   Living  1     SAB  2   TAB  0   Ectopic  0   Multiple  0   Live Births  1          Past Medical History:  Diagnosis Date  . Anticoagulant long-term use    lovenox during pregnancy  . 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 GERD (gastroesophageal reflux disease)   . Prothrombin gene mutation [facility](HCC)    Past Surgical History:  Procedure Laterality Date  . EXPLORATORY LAPAROTOMY LYSIS ADHESIONS FOR BOWEL OBSTRUCTION  2014  . LAPAROSCOPIC GASTRIC BANDING  2011  . LAPAROSCOPY CONVERTED TO OPEN GASTRIC BAND PORT REPOSITION  2019   Family History: family history is not on file. Social History:  reports that she quit smoking about 21 years ago. Her smoking use included cigarettes. She quit after 0.50 years of use. She has never used smokeless tobacco. She reports previous alcohol use. She reports that she does not use drugs.  ROS: See above otherwise negative  Prenatal labs:  ABO, Rh: --/--/PENDING (06/14 1740)pos Antibody: PENDING (06/14 1740)neg Rubella:  immune RPR:   neg HBsAg:   neg HIV:  neg GBS:   not done 1 hr Glucola: Normal (early) Genetic screening:  Normal Anatomy US: Normal  Physical Exam:   Dilation: Closed Effacement (%): Thick Station: Ballotable Exam by:: Milus GlazierJennifer Hamilton RN Blood pressure 134/72, pulse (!) 53, temperature 98.5 F (36.9 C), temperature source Oral, resp. rate 18, height 5\' 7"  (1.702 m), weight 99.8 kg, last menstrual period 04/20/2018. A&O x 3 HEENT: Normal Lungs: CTAB CV: RRR Abdominal: Soft, Non-tender and Gravid Lower Extremities: Non-edematous, Non-tender  Pelvic Exam: Deferred d/t recent exam Labs: Pending  Urine: No results found for: COLORURINE, APPEARANCEUR, LABSPEC, PHURINE, GLUCOSEU, HGBUR, BILIRUBINUR, KETONESUR, PROTEINUR, NITRITE, LEUKOCYTESUR   Prenatal Transfer Tool  Maternal Diabetes: No Genetic Screening: Normal Maternal Ultrasounds/Referrals: Normal Fetal Ultrasounds or other Referrals:  None Maternal Substance Abuse:  No Significant Maternal Medications: none Significant Maternal Lab Results: None  U/s 6/12: cephalic, normal afi, no gross abnormalities  Assessment/Plan:  38 y.o. G4P1021 at 6239w1d gestation   1. IUFD - plan cytotec/pitocin; anticipate svd; check torch labs, KB, offered autopsy and pt to consider, will also consider microarray; will send placenta to pathology; pt understands will take time for results and will contin to follow as outpt; patient/husband and nurses were praying when entering room, pastoral care offered; pt appropriately grieving; understands despite testing may not find cause and pt has insight to this; patient and husband understand that they  have time to make decisions about testing and understand plan for IOL 2. Prothrombin gene mutation - stopped lovenox 6/12 (last dose then); no h/o dvt/pe; resume 24 hrs after delivery 3. Anxiety, h/o pp depression - will monitor closely, consider zoloft with discharge 4. cephalic   Charyl Bigger 10/20/2018, 7:20 PM   Labs reviewed  CBC Latest Ref Rng & Units 10/20/2018 07/29/2018  WBC 4.0 - 10.5 K/uL  11.4(H) 9.8  Hemoglobin 12.0 - 15.0 g/dL 12.8 11.9(L)  Hematocrit 36.0 - 46.0 % 37.5 36.2  Platelets 150 - 400 K/uL 225 223   CMP Latest Ref Rng & Units 10/20/2018 07/29/2018  Glucose 70 - 99 mg/dL 79 93  BUN 6 - 20 mg/dL 14 14  Creatinine 0.44 - 1.00 mg/dL 1.03(H) 0.64  Sodium 135 - 145 mmol/L 137 135  Potassium 3.5 - 5.1 mmol/L 4.1 3.9  Chloride 98 - 111 mmol/L 105 102  CO2 22 - 32 mmol/L 21(L) 22  Calcium 8.9 - 10.3 mg/dL 9.6 9.3  Total Protein 6.5 - 8.1 g/dL 6.6 -  Total Bilirubin 0.3 - 1.2 mg/dL 0.2(L) -  Alkaline Phos 38 - 126 U/L 77 -  AST 15 - 41 U/L 16 -  ALT 0 - 44 U/L 12 -   Elevated creatinine - new finding, will monitor uop closely and repeat pp; bun wnl and stable

## 2018-10-21 ENCOUNTER — Encounter (HOSPITAL_COMMUNITY): Payer: Self-pay

## 2018-10-21 ENCOUNTER — Inpatient Hospital Stay (HOSPITAL_COMMUNITY): Payer: 59 | Admitting: Anesthesiology

## 2018-10-21 LAB — CBC
HCT: 36.9 % (ref 36.0–46.0)
Hemoglobin: 12.7 g/dL (ref 12.0–15.0)
MCH: 31.4 pg (ref 26.0–34.0)
MCHC: 34.4 g/dL (ref 30.0–36.0)
MCV: 91.3 fL (ref 80.0–100.0)
Platelets: 211 10*3/uL (ref 150–400)
RBC: 4.04 MIL/uL (ref 3.87–5.11)
RDW: 13.2 % (ref 11.5–15.5)
WBC: 12.1 10*3/uL — ABNORMAL HIGH (ref 4.0–10.5)
nRBC: 0 % (ref 0.0–0.2)

## 2018-10-21 LAB — COMPREHENSIVE METABOLIC PANEL
ALT: 11 U/L (ref 0–44)
AST: 16 U/L (ref 15–41)
Albumin: 2.9 g/dL — ABNORMAL LOW (ref 3.5–5.0)
Alkaline Phosphatase: 74 U/L (ref 38–126)
Anion gap: 10 (ref 5–15)
BUN: 10 mg/dL (ref 6–20)
CO2: 23 mmol/L (ref 22–32)
Calcium: 9 mg/dL (ref 8.9–10.3)
Chloride: 106 mmol/L (ref 98–111)
Creatinine, Ser: 0.93 mg/dL (ref 0.44–1.00)
GFR calc Af Amer: >60 mL/min (ref >60)
GFR calc non Af Amer: >60 mL/min (ref >60)
Glucose, Bld: 98 mg/dL (ref 70–99)
Potassium: 3.8 mmol/L (ref 3.5–5.1)
Sodium: 139 mmol/L (ref 135–145)
Total Bilirubin: 0.3 mg/dL (ref 0.3–1.2)
Total Protein: 5.9 g/dL — ABNORMAL LOW (ref 6.5–8.1)

## 2018-10-21 LAB — TOXOPLASMA GONDII ANTIBODY, IGG: Toxoplasma IgG Ratio: <3 IU/mL (ref 0.0–7.1)

## 2018-10-21 LAB — RPR: RPR Ser Ql: NONREACTIVE

## 2018-10-21 LAB — RUBELLA SCREEN: Rubella: 1.47 index (ref >0.99)

## 2018-10-21 LAB — CMV ANTIBODY, IGG (EIA): CMV Ab - IgG: >10 U/mL — ABNORMAL HIGH (ref 0.00–0.59)

## 2018-10-21 LAB — HSV 1 ANTIBODY, IGG: HSV 1 Glycoprotein G Ab, IgG: 56.7 index — ABNORMAL HIGH (ref 0.00–0.90)

## 2018-10-21 MED ORDER — DIPHENHYDRAMINE HCL 50 MG/ML IJ SOLN: 12.5000 mg | INTRAMUSCULAR | Status: DC | PRN

## 2018-10-21 MED ORDER — BUTORPHANOL TARTRATE 1 MG/ML IJ SOLN
2.0000 mg | INTRAMUSCULAR | Status: DC | PRN
  Administered 2018-10-21: 2 mg via INTRAVENOUS
  Filled 2018-10-21: qty 2

## 2018-10-21 MED ORDER — SENNOSIDES-DOCUSATE SODIUM 8.6-50 MG PO TABS: 2.0000 | ORAL_TABLET | ORAL | Status: DC

## 2018-10-21 MED ORDER — SERTRALINE HCL 50 MG PO TABS: 50.0000 mg | ORAL_TABLET | Freq: Every day | ORAL | 2 refills | Status: DC

## 2018-10-21 MED ORDER — PHENYLEPHRINE 40 MCG/ML (10ML) SYRINGE FOR IV PUSH (FOR BLOOD PRESSURE SUPPORT)
80.0000 ug | PREFILLED_SYRINGE | INTRAVENOUS | Status: DC | PRN
  Filled 2018-10-21 (×2): qty 10

## 2018-10-21 MED ORDER — SODIUM CHLORIDE (PF) 0.9 % IJ SOLN
INTRAMUSCULAR | Status: DC | PRN
  Administered 2018-10-21: 12 mL/h via EPIDURAL

## 2018-10-21 MED ORDER — LACTATED RINGERS IV SOLN
500.0000 mL | Freq: Once | INTRAVENOUS | Status: AC
  Administered 2018-10-21: 500 mL via INTRAVENOUS

## 2018-10-21 MED ORDER — LIDOCAINE HCL (PF) 1 % IJ SOLN
INTRAMUSCULAR | Status: DC | PRN
  Administered 2018-10-21: 7 mL via EPIDURAL
  Administered 2018-10-21: 6 mL via EPIDURAL

## 2018-10-21 MED ORDER — WITCH HAZEL-GLYCERIN EX PADS: 1.0000 "application " | MEDICATED_PAD | CUTANEOUS | Status: DC | PRN

## 2018-10-21 MED ORDER — FENTANYL-BUPIVACAINE-NACL 0.5-0.125-0.9 MG/250ML-% EP SOLN
12.0000 mL/h | EPIDURAL | Status: DC | PRN
  Filled 2018-10-21: qty 250

## 2018-10-21 MED ORDER — DIBUCAINE (PERIANAL) 1 % EX OINT: 1.0000 "application " | TOPICAL_OINTMENT | CUTANEOUS | Status: DC | PRN

## 2018-10-21 MED ORDER — PHENYLEPHRINE 40 MCG/ML (10ML) SYRINGE FOR IV PUSH (FOR BLOOD PRESSURE SUPPORT)
80.0000 ug | PREFILLED_SYRINGE | INTRAVENOUS | Status: DC | PRN
  Filled 2018-10-21: qty 10

## 2018-10-21 MED ORDER — ACETAMINOPHEN 325 MG PO TABS: 650.0000 mg | ORAL_TABLET | ORAL | Status: DC | PRN

## 2018-10-21 MED ORDER — IBUPROFEN 600 MG PO TABS: 600.0000 mg | ORAL_TABLET | Freq: Four times a day (QID) | ORAL | 0 refills | Status: AC

## 2018-10-21 MED ORDER — PRENATAL MULTIVITAMIN CH: 1.0000 | ORAL_TABLET | Freq: Every day | ORAL | Status: DC

## 2018-10-21 MED ORDER — ONDANSETRON HCL 4 MG/2ML IJ SOLN: 4.0000 mg | INTRAMUSCULAR | Status: DC | PRN

## 2018-10-21 MED ORDER — EPHEDRINE 5 MG/ML INJ
10.0000 mg | INTRAVENOUS | Status: DC | PRN
  Filled 2018-10-21: qty 2

## 2018-10-21 MED ORDER — SIMETHICONE 80 MG PO CHEW: 80.0000 mg | CHEWABLE_TABLET | ORAL | Status: DC | PRN

## 2018-10-21 MED ORDER — SERTRALINE HCL 50 MG PO TABS
50.0000 mg | ORAL_TABLET | Freq: Every day | ORAL | Status: DC
  Administered 2018-10-21: 50 mg via ORAL
  Filled 2018-10-21 (×2): qty 1

## 2018-10-21 MED ORDER — BENZOCAINE-MENTHOL 20-0.5 % EX AERO: 1.0000 "application " | INHALATION_SPRAY | CUTANEOUS | Status: DC | PRN

## 2018-10-21 MED ORDER — ONDANSETRON HCL 4 MG PO TABS: 4.0000 mg | ORAL_TABLET | ORAL | Status: DC | PRN

## 2018-10-21 MED ORDER — DIPHENHYDRAMINE HCL 25 MG PO CAPS: 25.0000 mg | ORAL_CAPSULE | Freq: Four times a day (QID) | ORAL | Status: DC | PRN

## 2018-10-21 MED ORDER — IBUPROFEN 600 MG PO TABS
600.0000 mg | ORAL_TABLET | Freq: Four times a day (QID) | ORAL | Status: DC
  Administered 2018-10-21: 600 mg via ORAL
  Filled 2018-10-21: qty 1

## 2018-10-21 MED ORDER — COCONUT OIL OIL: 1.0000 "application " | TOPICAL_OIL | Status: DC | PRN

## 2018-10-21 MED ORDER — TETANUS-DIPHTH-ACELL PERTUSSIS 5-2.5-18.5 LF-MCG/0.5 IM SUSP: 0.5000 mL | Freq: Once | INTRAMUSCULAR | Status: DC

## 2018-10-21 NOTE — Anesthesia Procedure Notes (Signed)
Epidural Patient location during procedure: OB Start time: 10/21/2018 3:41 AM End time: 10/21/2018 3:44 AM  Staffing Anesthesiologist: Lyn Hollingshead, MD Performed: anesthesiologist   Preanesthetic Checklist Completed: patient identified, site marked, surgical consent, pre-op evaluation, timeout performed, IV checked, risks and benefits discussed and monitors and equipment checked  Epidural Patient position: sitting Prep: site prepped and draped and DuraPrep Patient monitoring: continuous pulse ox and blood pressure Approach: midline Location: L3-L4 Injection technique: LOR air  Needle:  Needle type: Tuohy  Needle gauge: 17 G Needle length: 9 cm and 9 Needle insertion depth: 7 cm Catheter type: closed end flexible Catheter size: 19 Gauge Catheter at skin depth: 12 cm Test dose: negative and Other  Assessment Sensory level: T9 Events: blood not aspirated, injection not painful, no injection resistance, negative IV test and no paresthesia  Additional Notes Reason for block:procedure for pain

## 2018-10-21 NOTE — Progress Notes (Signed)
INitial visit with Luetta Nutting and Harrell Gave to introduce spiritual care services and offer support after the delivery of their son Harrell Gave who died in utero.  [place] shared her gratitude for the team and her feeling that everything was orchestrated in the most hopeful way possible given the circumstances.  They shared that baby Harrell Gave was born in the caul which the medical team told them was unusual and special.  FOB, Harrell Gave stated that he'd been praying that his wife would have an easy delivery and Kiala shared that she was able to focus on the experience rather than her pain.  The couple have a 41.27 year old daughter, Karle Starch, and we talked a bit about how to tell her about her brother and support her through the loss.  The family has decided they will work with [facility] and plan to choose cremation.  I offered some space and presence for them to process their initial feelings post delivery, but will continue to follow up throughout the day.  Please page as further needs arise.  Donald Prose. Elyn Peers, M.Div. [facility] Chaplain Pager [phone] Office [phone]

## 2018-10-21 NOTE — Discharge Planning (Signed)
Pt. Voided at 12:30. Photos complete. Pt and SO given time to say good bye. Baby boy processed for post mortum care. Anmra testing slip with pt's address and insurance info sent back to L./D

## 2018-10-21 NOTE — Anesthesia Postprocedure Evaluation (Signed)
Anesthesia Post Note  Patient: Nurse, learning disability  Procedure(s) Performed: AN AD HOC LABOR EPIDURAL     Patient location during evaluation: Mother Baby Anesthesia Type: Epidural Level of consciousness: awake Pain management: satisfactory to patient Vital Signs Assessment: post-procedure vital signs reviewed and stable Respiratory status: spontaneous breathing Cardiovascular status: stable Anesthetic complications: no    Last Vitals:  Vitals:   10/21/18 0931 10/21/18 1102  BP: 112/64 130/77  Pulse: (!) 56 69  Resp: 18 18  Temp:  37 C  SpO2:  98%    Last Pain:  Vitals:   10/21/18 1130  TempSrc:   PainSc: 0-No pain   Pain Goal:                   [employer]

## 2018-10-21 NOTE — Anesthesia Preprocedure Evaluation (Signed)
Anesthesia Evaluation  Patient identified by MRN, date of birth, ID band Patient awake    Reviewed: Allergy & Precautions, NPO status , Patient's Chart, lab work & pertinent test results  History of Anesthesia Complications Negative for: history of anesthetic complications  Airway Mallampati: II  TM Distance: >3 FB Neck ROM: Full    Dental no notable dental hx. (+) Dental Advisory Given, Teeth Intact   Pulmonary former smoker,    Pulmonary exam normal breath sounds clear to auscultation       Cardiovascular negative cardio ROS Normal cardiovascular exam Rhythm:Regular Rate:Normal     Neuro/Psych negative neurological ROS  negative psych ROS   GI/Hepatic Neg liver ROS, GERD  Poorly Controlled,Malpositioned lap band   Endo/Other  obese  Renal/GU negative Renal ROS     Musculoskeletal   Abdominal (+) + obese,   Peds  Hematology Factor II deficiency: lovenox   Anesthesia Other Findings   Reproductive/Obstetrics (+) Pregnancy (due 01/25/2019 (14 weeks gest))                             Anesthesia Physical  Anesthesia Plan  ASA: III  Anesthesia Plan: Epidural   Post-op Pain Management:    Induction:   PONV Risk Score and Plan: Treatment may vary due to age or medical condition  Airway Management Planned:   Additional Equipment:   Intra-op Plan:   Post-operative Plan:   Informed Consent: I have reviewed the patients History and Physical, chart, labs and discussed the procedure including the risks, benefits and alternatives for the proposed anesthesia with the patient or authorized representative who has indicated his/her understanding and acceptance.       Plan Discussed with:   Anesthesia Plan Comments:         Anesthesia Quick Evaluation

## 2018-10-21 NOTE — Progress Notes (Signed)
Now I Lay me Down to Sleep photographers here for pt's session. RN to bedside to assess pt's movement. Pt. Sat on side of bed, stood and ambulated in room. Pt's husband assisted. Pt. Stated she felt a little "wobbly" but did well. Pt. And husband encouraged to call staff for OOB needs.  Pt. Due to Void Labs to be drawn Anora ppwrk to be completed by pt.

## 2018-10-22 LAB — HSV-2 IGG SUPPLEMENTAL TEST: HSV-2 IgG Supplemental Test: POSITIVE — AB

## 2018-10-22 LAB — HSV 2 ANTIBODY, IGG: HSV 2 Glycoprotein G Ab, IgG: 1.88 index — ABNORMAL HIGH (ref 0.00–0.90)

## 2018-10-25 ENCOUNTER — Other Ambulatory Visit: Payer: 59

## 2018-10-28 ENCOUNTER — Other Ambulatory Visit: Payer: Self-pay

## 2018-10-28 ENCOUNTER — Emergency Department (HOSPITAL_BASED_OUTPATIENT_CLINIC_OR_DEPARTMENT_OTHER)
Admission: EM | Admit: 2018-10-28 | Discharge: 2018-10-28 | Disposition: A | Discharge status: Home / Self Care | Payer: 59 | Attending: Emergency Medicine | Admitting: Emergency Medicine

## 2018-10-28 ENCOUNTER — Other Ambulatory Visit: Payer: Self-pay | Admitting: Family Medicine

## 2018-10-28 ENCOUNTER — Encounter (HOSPITAL_BASED_OUTPATIENT_CLINIC_OR_DEPARTMENT_OTHER): Payer: Self-pay

## 2018-10-28 DIAGNOSIS — R109 Unspecified abdominal pain: Secondary | ICD-10-CM | POA: Insufficient documentation

## 2018-10-28 DIAGNOSIS — Z5321 Procedure and treatment not carried out due to patient leaving prior to being seen by health care provider: Secondary | ICD-10-CM | POA: Diagnosis not present

## 2018-10-28 NOTE — ED Triage Notes (Addendum)
C/o RLQ pain x 3 days-denies urinary sx, vaginal d/c and n/v/d-pt states she had a still birth delivery on 6/15-NAD-steady gait

## 2018-10-28 NOTE — ED Notes (Signed)
After triage pt states she may leave due to recent event of still birth delivery "and this is really triggering it"-pt advised to let staff know if she is leaving and to return prn

## 2018-10-30 ENCOUNTER — Other Ambulatory Visit: Payer: Self-pay | Admitting: Obstetrics and Gynecology

## 2018-10-30 DIAGNOSIS — R1011 Right upper quadrant pain: Secondary | ICD-10-CM

## 2018-10-31 ENCOUNTER — Ambulatory Visit
Admit: 2018-10-31 | Discharge: 2018-10-31 | Disposition: A | Discharge status: Home / Self Care | Payer: 59 | Attending: Obstetrics and Gynecology | Admitting: Obstetrics and Gynecology

## 2018-10-31 ENCOUNTER — Other Ambulatory Visit: Payer: Self-pay | Admitting: Obstetrics and Gynecology

## 2018-10-31 DIAGNOSIS — R1011 Right upper quadrant pain: Secondary | ICD-10-CM

## 2018-10-31 NOTE — Discharge Summary (Signed)
Obstetric Discharge Summary Reason for Admission: induction of labor; iufd Prenatal Procedures: none Intrapartum Procedures: none Postpartum Procedures: fetus for autopsy Complications-Operative and Postpartum: slightly elevated creatinine, improved on repeat Hemoglobin  Date Value Ref Range Status  10/21/2018 12.7 12.0 - 15.0 g/dL Final   HCT  Date Value Ref Range Status  10/21/2018 36.9 36.0 - 46.0 % Final    Physical Exam:  General: alert and cooperative Lochia: appropriate Uterine Fundus: firm Incision: n/a DVT Evaluation: No evidence of DVT seen on physical exam.  Discharge Diagnoses: s/p svd of 26 wk iufd  Discharge Information: Date: 10/31/2018 Activity: pelvic rest Diet: routine Medications: Ibuprofen and resume lovenox Condition: stable Instructions: refer to practice specific booklet Discharge to: home Follow-up Information    Charyl Bigger, MD Follow up.   Specialty: Obstetrics and Gynecology Contact information: [place] [address] [phone]           Newborn Data: Live born female  Birth Weight: 1 lb 5.1 oz (598 g) APGAR: 0, 0  Newborn Delivery   Birth date/time: 10/21/2018 06:38:00 Delivery type: Vaginal, Spontaneous     Placenta to pathology for evaluation.  Autopsy to be done on fetus.   Charyl Bigger 10/31/2018, 9:25 AM

## 2018-11-06 ENCOUNTER — Other Ambulatory Visit: Payer: Self-pay | Admitting: Obstetrics and Gynecology

## 2018-11-20 ENCOUNTER — Telehealth: Payer: Self-pay

## 2018-11-20 NOTE — Telephone Encounter (Signed)
Questions for Screening COVID-19  Symptom onset: n/a  Travel or Contacts: no  During this illness, did/does the patient experience any of the following symptoms? Fever >100.88F []   Yes [x]   No []   Unknown Subjective fever (felt feverish) []   Yes [x]   No []   Unknown Chills []   Yes [x]   No []   Unknown Muscle aches (myalgia) []   Yes [x]   No []   Unknown Runny nose (rhinorrhea) []   Yes [x]   No []   Unknown Sore throat []   Yes [x]   No []   Unknown Cough (new onset or worsening of chronic cough) []   Yes [x]   No []   Unknown Shortness of breath (dyspnea) []   Yes [x]   No []   Unknown Nausea or vomiting []   Yes [x]   No []   Unknown Headache []   Yes [x]   No []   Unknown Abdominal pain  []   Yes [x]   No []   Unknown Diarrhea (?3 loose/looser than normal stools/24hr period) []   Yes [x]   No []   Unknown Other, specify:  Patient risk factors: Smoker? []   Current []   Former []   Never If female, currently pregnant? []   Yes []   No  Patient Active Problem List   Diagnosis Date Noted  . Indication for care in labor or delivery 10/20/2018    Plan:  []   High risk for COVID-19 with red flags go to ED (with CP, SOB, weak/lightheaded, or fever > 101.5). Call ahead.  []   High risk for COVID-19 but stable. Inform provider and coordinate time for [facility] visit.   []   No red flags but URI signs or symptoms okay for [facility] visit.

## 2018-11-21 ENCOUNTER — Encounter: Payer: Self-pay | Admitting: Family Medicine

## 2018-11-21 ENCOUNTER — Ambulatory Visit (INDEPENDENT_AMBULATORY_CARE_PROVIDER_SITE_OTHER): Payer: 59 | Admitting: Family Medicine

## 2018-11-21 VITALS — BP 118/78 | HR 63 | Temp 98.2°F | Ht 67.0 in | Wt 231.8 lb

## 2018-11-21 DIAGNOSIS — H00015 Hordeolum externum left lower eyelid: Secondary | ICD-10-CM | POA: Diagnosis not present

## 2018-11-21 DIAGNOSIS — Z8759 Personal history of other complications of pregnancy, childbirth and the puerperium: Secondary | ICD-10-CM | POA: Insufficient documentation

## 2018-11-21 DIAGNOSIS — D6852 Prothrombin gene mutation: Secondary | ICD-10-CM | POA: Diagnosis not present

## 2018-11-21 DIAGNOSIS — Z7689 Persons encountering health services in other specified circumstances: Secondary | ICD-10-CM

## 2018-11-21 MED ORDER — ERYTHROMYCIN 5 MG/GM OP OINT: 1.0000 "application " | TOPICAL_OINTMENT | Freq: Every day | OPHTHALMIC | 0 refills | Status: AC

## 2018-11-21 NOTE — Progress Notes (Signed)
Cindy Krueger is a 38 y.o. female  Chief Complaint  Patient presents with  . Establish Care    est care/went through fertility and found out has blood clotting disease/ lost son recently    HPI: Cindy Krueger is a 38 y.o. female here to establish care with our office. Pt is married, 49yo daughter Karle Starch. They moved to [place] from [place] in early 2020.  Pt recently suffered an IUFD with stillbirth at 63+ wks (baby boy). She was diagnosed after mutliple miscarriages with prothrombin gene mutation, Factor II deficiency. She was on lovenox with both pregnancies. She state autopsy report and placenta pathology just resulted and placental bloodflow was found to be 40% occluded d/t clot formation. Pt is wondering if she were to get pregnant again, would she need different/additional anticoagulation. She also wonders whether she should be on long term anticoagulation therapy herself. She does not have a h/o DVT, PE.   She is taking zoloft 50mg  daily and was started on this prior to discharge after her stillbirth. She states she had PPD after her daughter was born but wasn't aware she had it/wasn't diagnosed at that time. She feels the zoloft has been helpful overall. She is coping and processing her feelings, sleeping ok, eating ok (too much at times), is thankful to have her 3yo daughter to keep her busy and focused on things other than the loss.   She notes a stye on her Lt lower eyelid x 2 weeks. No change in size. Less irritating/painful now than it was previously. She has not done anything for it. No vision changes.  Specialists: none  Last CPE, labs: 05/2018 Last PAP: 06/2018  Med refills needed today: none   Past Medical History:  Diagnosis Date  . Anticoagulant long-term use    lovenox during pregnancy  . Factor II deficiency ([facility])    per pt dx 2017 with genetic testing  . GERD (gastroesophageal reflux disease)   . Prothrombin gene mutation [facility])     Past Surgical History:  Procedure  Laterality Date  . EXPLORATORY LAPAROTOMY LYSIS ADHESIONS FOR BOWEL OBSTRUCTION  2014  . LAPAROSCOPIC GASTRIC BANDING  2011  . LAPAROSCOPY CONVERTED TO OPEN GASTRIC BAND PORT REPOSITION  2019    Social History   Socioeconomic History  . Marital status: Married    Spouse name: Not on file  . Number of children: Not on file  . Years of education: Not on file  . Highest education level: Not on file  Occupational History  . Not on file  Social Needs  . Financial resource strain: Not on file  . Food insecurity    Worry: Not on file    Inability: Not on file  . Transportation needs    Medical: Not on file    Non-medical: Not on file  Tobacco Use  . Smoking status: Former Smoker    Years: 0.50    Types: Cigarettes    Quit date: 07/28/1997    Years since quitting: 21.3  . Smokeless tobacco: Never Used  Substance and Sexual Activity  . Alcohol use: Yes    Comment: socially  . Drug use: Never  . Sexual activity: Not on file  Lifestyle  . Physical activity    Days per week: Not on file    Minutes per session: Not on file  . Stress: Not on file  Relationships  . Social Herbalist on phone: Not on file    Gets together: Not on  file    Attends religious service: Not on file    Active member of club or organization: Not on file    Attends meetings of clubs or organizations: Not on file    Relationship status: Not on file  . Intimate partner violence    Fear of current or ex partner: Not on file    Emotionally abused: Not on file    Physically abused: Not on file    Forced sexual activity: Not on file  Other Topics Concern  . Not on file  Social History Narrative  . Not on file    Family History  Problem Relation Age of Onset  . Heart disease Mother   . Hypertension Mother   . Rheum arthritis Mother   . Cancer Father        prostate     Immunization History  Administered Date(s) Administered  . Influenza,inj,Quad PF,6+ Mos 02/10/2018    Outpatient  Encounter Medications as of 11/21/2018  Medication Sig Note  . enoxaparin (LOVENOX) 40 MG/0.4ML injection Inject 40 mg into the skin daily. 07/29/2018: Per pt was told to stop lovenox 24 hours prior to surgery,  last dose today @0715  ,  07-29-2018  . sertraline (ZOLOFT) 50 MG tablet Take 1 tablet (50 mg total) by mouth daily.   . calcium carbonate (TUMS - DOSED IN MG ELEMENTAL CALCIUM) 500 MG chewable tablet Chew 1 tablet by mouth as needed for indigestion or heartburn.   . Cholecalciferol (VITAMIN D3) 125 MCG (5000 UT) TABS Take by mouth.   Marland Kitchen. ibuprofen (ADVIL) 600 MG tablet Take 1 tablet (600 mg total) by mouth every 6 (six) hours. (Patient not taking: Reported on 11/21/2018)   . Multiple Vitamin (MULTIVITAMIN) capsule Take by mouth.    No facility-administered encounter medications on file as of 11/21/2018.      ROS: Gen: no fever, chills  Skin: no rash, itching ENT: no ear pain, ear drainage, nasal congestion, rhinorrhea, sinus pressure, sore throat Eyes: as above in HPI Resp: no cough, wheeze,SOB CV: no CP, palpitations, LE edema,  GI: no heartburn, n/v/d/c, abd pain GU: no dysuria, urgency, frequency, hematuria  MSK: no joint pain, myalgias, back pain Neuro: no dizziness, headache, weakness, vertigo Psych: see HPI   No Known Allergies  BP 118/78   Pulse 63   Temp 98.2 F (36.8 C) (Oral)   Ht 5\' 7"  (1.702 m)   Wt 231 lb 12.8 oz (105.1 kg)   LMP 04/20/2018 (Exact Date)   SpO2 98%   BMI 36.31 kg/m   Physical Exam  Constitutional: She is oriented to person, place, and time. She appears well-developed and well-nourished. No distress.  Eyes: Lids are normal. Right eye exhibits no discharge, no exudate and no hordeolum. Left eye exhibits hordeolum. Left eye exhibits no discharge and no exudate.    Cardiovascular: Normal rate, regular rhythm and normal heart sounds.  Pulmonary/Chest: Effort normal and breath sounds normal.  Neurological: She is alert and oriented to person,  place, and time.  Psychiatric: She has a normal mood and affect. Her behavior is normal.     A/P:  1. Hordeolum externum of left lower eyelid - warm compress and baby shampoo scrubs BID Rx: - erythromycin ophthalmic ointment; Place 1 application into the left eye at bedtime for 10 days.  Dispense: 10 g; Refill: 0 - f/u if no improvement in 2 wks  2. Hypercoagulability due to prothrombin II mutation (HCC) - diagnosed after multiple miscarriages prior to her 3yo  daughter  - pt with questions related to anticoagulation for herself as well as if she were to become pregnant again. Will contact heme with these questions   3. Encounter to establish care with new doctor - UTD on CPE, labs, PAP  4. History of intrauterine fetal death - 10/2018, baby boy at 26+ wks gestation d/t placental clots/decreased blood flow

## 2018-11-27 ENCOUNTER — Encounter: Payer: Self-pay | Admitting: Family Medicine

## 2019-02-11 ENCOUNTER — Ambulatory Visit (HOSPITAL_COMMUNITY): Payer: 59

## 2019-03-10 ENCOUNTER — Encounter (HOSPITAL_COMMUNITY): Payer: Self-pay | Admitting: *Deleted

## 2019-03-10 ENCOUNTER — Ambulatory Visit (HOSPITAL_COMMUNITY): Payer: 59 | Admitting: *Deleted

## 2019-03-10 ENCOUNTER — Ambulatory Visit (HOSPITAL_COMMUNITY): Payer: 59 | Attending: Obstetrics and Gynecology | Admitting: Obstetrics

## 2019-03-10 ENCOUNTER — Other Ambulatory Visit: Payer: Self-pay

## 2019-03-10 DIAGNOSIS — Z3169 Encounter for other general counseling and advice on procreation: Secondary | ICD-10-CM | POA: Insufficient documentation

## 2019-03-10 DIAGNOSIS — Z8759 Personal history of other complications of pregnancy, childbirth and the puerperium: Secondary | ICD-10-CM | POA: Diagnosis not present

## 2019-03-10 DIAGNOSIS — D6859 Other primary thrombophilia: Secondary | ICD-10-CM

## 2019-03-10 DIAGNOSIS — O09299 Supervision of pregnancy with other poor reproductive or obstetric history, unspecified trimester: Secondary | ICD-10-CM

## 2019-03-10 NOTE — Progress Notes (Signed)
BP 117/70 P 63 T 98.8. Wt. 217.4lb, HT 67in

## 2019-03-11 NOTE — Progress Notes (Signed)
MFM Consult  This patient is a gravida 4 para 1-0-3-1 who was seen for a preconception consultation at the request of Dr. Amado NashAlmquist due to a history of a prior IUFD and thrombophilia.  The patient reports that in 2015 and 2016, she had two first trimester miscarriages.  She was then referred to a reproductive endocrinologist who performed a thrombophilia work-up.  The thrombophilia work-up was positive for the prothrombin gene 20210A mutation.  The patient does not know if she is homozygous or heterozygous for the prothrombin gene mutation.  She denies any history of a prior thromboembolic event.  She reports that her father has a history of multiple DVTs.  Due to the thrombophilia and her history of miscarriages, the patient reports that she was placed on prophylactic Lovenox 40 mg daily along with a daily baby aspirin when she was pregnant with her daughter in 2017.  In June 2020, the patient had a fetal demise that was found at around 26 weeks.  The patient reports that prior to the fetal demise, there were no complications in that pregnancy.  She was treated with prophylactic Lovenox in that pregnancy.  The placental pathology report from the demise showed infarcts in the placenta and intervillous thrombi in 30% of the sample.  The work-up from the demise also was positive for CMV IgG and IgG to HSV 1 and 2.  IgM for CMV and HSV 1 and 2 were not sent.  She also reports that the MicroArray test was normal and did not show any chromosomal abnormalities in the demised fetus.  The patient also has a history of a LAP-BAND surgery that was performed in [place] in 2009.  She experienced complications from the LAP-BAND port that required laparoscopic removal of the LAP-BAND port in March 2020.  She denies any other significant past medical or surgical history.  As the patient is considering conceiving a pregnancy in the near future, she was referred for consultation today to discuss the management of her  future pregnancy in order to help her achieve a more successful outcome.  The patient was advised that the cause of the fetal demise earlier this year remains undetermined.  It is uncertain if the infarcts in the placenta and the thrombi in the intervillous space occurred prior to or after the demise.  She was advised that the main benefit of a thrombophilia work-up is to determine someone's risk of a thromboembolic event in their lifetime.  She was reassured that roughly 2% to 5% of the population carry the prothrombin gene mutation.  Most people will live a normal life without any major issues.  Currently, there is insufficient evidence linking inherited thrombophilias with fetal loss, placental abruption, fetal growth restriction, or development of preeclampsia.  It is therefore unlikely that her thrombophilia contributed to the fetal demise.  She was advised that the thrombi noted in the intervillous space could have resulted from the cessation of blood flow after the demise occurred.  Therefore, the cause of her prior demise remains undetermined.  The patient was reassured that her history of bariatric surgery probably did not contribute to the demise.  The patient was reassured that women who have had a prior fetal demise in one pregnancy, usually go on to have a successful pregnancy outcome in their subsequent pregnancy.  Therefore, I believe that if she and her husband desire another child, it may be reasonable for her to try again.  The patient reported that she was probably going to lose some  weight prior to conceiving her next pregnancy.  I advised her that weight loss should occur prior to conception and not during pregnancy.  For the management of her future pregnancy, I would recommend that she have an early ultrasound in the first trimester to confirm her dates.  Once a viable intrauterine pregnancy has been confirmed, she may be started on prophylactic Lovenox (40 mg daily or at a dosage of 1  mg/kg/day if she weighs above 160 pounds).  Anticoagulation prophylaxis is primarily for prevention of a thromboembolic event as she has a first-degree relative who has had a thromboembolic event in the setting of a low risk thrombophilia.   Due to advanced maternal age, she should be offered a first trimester screening test such as the cell free DNA test to screen for fetal aneuploidy.  She should also have a nuchal translucency measurement at between 11-13 completed weeks.  The patient was advised to start taking a daily baby aspirin (81 mg daily) starting at 12 weeks of her future pregnancy as it would do no harm and could be used for preeclampsia prophylaxis.  She should then have a fetal anatomy scan performed at around 19 weeks.  To decrease her risk of another adverse outcome, she should then be followed with monthly growth ultrasounds.  Twice-weekly fetal testing using either nonstress tests or biophysical profile should be performed starting at around 28 weeks.  Should the fetal growth and fetal testing be normal, delivery should occur at around 38 weeks.  The patient understands that hopefully with intense maternal and fetal surveillance, she will have a successful pregnancy outcome.  Due to her history of a prior demise, she may be at increased risk for another demise.  The patient should be placed back on prophylactic Lovenox for 6 weeks postpartum, as this would be the time of greatest risk for a thromboembolic event.  The patient inquired if there was an anticoagulation medication that could pass through the placenta and prevent a thrombi either in the placenta or the umbilical cord.  She was advised that anticoagulation that can cross the placenta (warfarin) is contraindicated in pregnancy as it would fully anticoagulate the fetus and may predispose the fetus to internal bleeding and intracranial hemorrhage.  Regarding the management of prophylactic anticoagulation in pregnancy, I would recommend  that she remain on prophylactic Lovenox up until the day prior to her scheduled induction.  The patient was advised that as she is only treated with a prophylactic dose of anticoagulation, that she would be eligible for regional anesthesia 12 hours following her last dose of anticoagulation medication.   At the end of the consultation, the patient stated that all her questions had been answered to her complete satisfaction.  Thank you for referring this very nice patient for a Maternal-Fetal Medicine preconception consultation.  We look forward to managing her with you in the future.  A total of 45 minutes was spent counseling and coordinating the care for this patient.  Greater than 50% of the time was spent in direct face-to-face contact.  Recommendations: Weight loss prior to pregnancy First trimester ultrasound to confirm dates Start prophylactic Lovenox 40 mg daily once a fetal heartbeat has been documented Cell free DNA test and nuchal translucency measurement at 10 to 13 weeks Start daily baby aspirin (81 mg daily) at 12 weeks Fetal anatomy scan at around 19 weeks Growth ultrasound every 4 weeks Twice-weekly fetal testing starting at 28 weeks and continued until delivery Induction of labor at  around 38 weeks Hold Lovenox the day before scheduled induction Resume Lovenox in the postpartum period and continue for 6 weeks postpartum

## 2020-01-02 ENCOUNTER — Other Ambulatory Visit: Payer: Self-pay

## 2020-01-02 ENCOUNTER — Emergency Department (HOSPITAL_COMMUNITY): Payer: Self-pay

## 2020-01-02 ENCOUNTER — Encounter (HOSPITAL_COMMUNITY): Payer: Self-pay | Admitting: Emergency Medicine

## 2020-01-02 ENCOUNTER — Emergency Department (HOSPITAL_COMMUNITY)
Admission: EM | Admit: 2020-01-02 | Discharge: 2020-01-02 | Disposition: A | Discharge status: Home / Self Care | Payer: Self-pay | Attending: Emergency Medicine | Admitting: Emergency Medicine

## 2020-01-02 DIAGNOSIS — R0602 Shortness of breath: Secondary | ICD-10-CM | POA: Insufficient documentation

## 2020-01-02 DIAGNOSIS — Z5321 Procedure and treatment not carried out due to patient leaving prior to being seen by health care provider: Secondary | ICD-10-CM | POA: Insufficient documentation

## 2020-01-02 DIAGNOSIS — R079 Chest pain, unspecified: Secondary | ICD-10-CM | POA: Insufficient documentation

## 2020-01-02 DIAGNOSIS — Z20822 Contact with and (suspected) exposure to covid-19: Secondary | ICD-10-CM | POA: Insufficient documentation

## 2020-01-02 MED ORDER — ACETAMINOPHEN 500 MG PO TABS: 1000.0000 mg | ORAL_TABLET | Freq: Once | ORAL | Status: DC

## 2020-01-02 NOTE — ED Triage Notes (Signed)
Per pt, states was diagnosed with covid 9 days ago-states increased SOB and chest tightness-thinks she might have PNA-did a home covid test

## 2020-01-05 ENCOUNTER — Telehealth: Payer: Self-pay | Admitting: Family Medicine

## 2020-01-05 NOTE — Telephone Encounter (Signed)
Please advise 

## 2020-01-05 NOTE — Telephone Encounter (Signed)
Patient left message with after hours service: States she tested positive for Covid 9 days ago. She began running a fever and went to ER. CXR taken but she did not see an doctor. Temp 101 and still SOB with cough. Mychart showed pneumonia. Patient stated she sat in ER lobby for 8 hours. Wants someone here to look at her Xray. Looks as if she has double pneumonia.  Sending to doctor of the day.

## 2020-01-06 ENCOUNTER — Encounter: Payer: Self-pay | Admitting: Family

## 2020-01-06 ENCOUNTER — Other Ambulatory Visit: Payer: Self-pay

## 2020-01-06 ENCOUNTER — Telehealth (INDEPENDENT_AMBULATORY_CARE_PROVIDER_SITE_OTHER): Payer: Self-pay | Admitting: Family

## 2020-01-06 VITALS — Ht 67.0 in | Wt 199.0 lb

## 2020-01-06 DIAGNOSIS — R05 Cough: Secondary | ICD-10-CM

## 2020-01-06 DIAGNOSIS — R059 Cough, unspecified: Secondary | ICD-10-CM

## 2020-01-06 DIAGNOSIS — J1282 Pneumonia due to coronavirus disease 2019: Secondary | ICD-10-CM

## 2020-01-06 DIAGNOSIS — U071 COVID-19: Secondary | ICD-10-CM

## 2020-01-06 MED ORDER — PREDNISONE 20 MG PO TABS: 40.0000 mg | ORAL_TABLET | Freq: Every day | ORAL | 0 refills | Status: DC

## 2020-01-06 MED ORDER — ALBUTEROL SULFATE HFA 108 (90 BASE) MCG/ACT IN AERS: 2.0000 | INHALATION_SPRAY | Freq: Four times a day (QID) | RESPIRATORY_TRACT | 0 refills | Status: DC | PRN

## 2020-01-06 NOTE — Telephone Encounter (Signed)
Please schedule video appt

## 2020-01-06 NOTE — Progress Notes (Signed)
Virtual Visit via Video   I connected with patient on 01/06/20 at  4:00 PM EDT by a video enabled telemedicine application and verified that I am speaking with the correct person using two identifiers.  Location patient: Home Location provider: [facility], Office Persons participating in the virtual visit: Patient, Provider, CMA   I discussed the limitations of evaluation and management by telemedicine and the availability of in person appointments. The patient expressed understanding and agreed to proceed.  Subjective:   HPI:   Patient is in today for a recheck of pneumonia related to COVID-19. She reports continuing to have a cough and feeling winded. She is requesting an albuterol inhaler. She continues to have no taste or smell. She is able to ambulate but gets winded. She has improved significantly. Unvaccinated. Husband was positive as well but is vaccinated and did well.   ROS:   See pertinent positives and negatives per HPI.  Patient Active Problem List   Diagnosis Date Noted  . Hypercoagulability due to prothrombin II mutation (HCC) 11-25-18  . History of intrauterine fetal death 11-25-2018  . Indication for care in labor or delivery 10/20/2018    Social History   Tobacco Use  . Smoking status: Former Smoker    Years: 0.50    Types: Cigarettes    Quit date: 07/28/1997    Years since quitting: 22.4  . Smokeless tobacco: Never Used  Substance Use Topics  . Alcohol use: Yes    Comment: socially    Current Outpatient Medications:  .  Calcium Carb-Cholecalciferol (CALCIUM 1000 + D) 1000-800 MG-UNIT TABS, every day, Disp: , Rfl:  .  calcium carbonate (TUMS - DOSED IN MG ELEMENTAL CALCIUM) 500 MG chewable tablet, Chew 1 tablet by mouth as needed for indigestion or heartburn., Disp: , Rfl:  .  Cholecalciferol (VITAMIN D3) 125 MCG (5000 UT) TABS, Take by mouth., Disp: , Rfl:  .  ibuprofen (ADVIL) 600 MG tablet, Take 1 tablet (600 mg total) by mouth every 6  (six) hours., Disp: 30 tablet, Rfl: 0 .  Magnesium 100 MG CAPS, every day, Disp: , Rfl:  .  Multiple Vitamin (MULTIVITAMIN) capsule, Take by mouth., Disp: , Rfl:  .  sertraline (ZOLOFT) 50 MG tablet, Take 1 tablet (50 mg total) by mouth daily., Disp: 30 tablet, Rfl: 2 .  albuterol (VENTOLIN HFA) 108 (90 Base) MCG/ACT inhaler, Inhale 2 puffs into the lungs every 6 (six) hours as needed for wheezing or shortness of breath., Disp: 8 g, Rfl: 0 .  enoxaparin (LOVENOX) 40 MG/0.4ML injection, Inject 40 mg into the skin daily. (Patient not taking: Reported on 01/06/2020), Disp: , Rfl:  .  predniSONE (DELTASONE) 20 MG tablet, Take 2 tablets (40 mg total) by mouth daily with breakfast., Disp: 10 tablet, Rfl: 0  No Known Allergies  Objective:   Ht 5\' 7"  (1.702 m)   Wt 199 lb (90.3 kg) Comment: pt reported  LMP 12/17/2019 (Approximate)   BMI 31.17 kg/m   Patient is well-developed, well-nourished in no acute distress.  Resting comfortably at home.  Head is normocephalic, atraumatic.  No labored breathing.  Speech is clear and coherent with logical content.  Patient is alert and oriented at baseline.    Assessment and Plan:    Cindy Krueger was seen today for acute visit.  Diagnoses and all orders for this visit:  COVID-19  Cough  Pneumonia due to COVID-19 virus  Other orders -     albuterol (VENTOLIN HFA) 108 (90 Base)  MCG/ACT inhaler; Inhale 2 puffs into the lungs every 6 (six) hours as needed for wheezing or shortness of breath. -     predniSONE (DELTASONE) 20 MG tablet; Take 2 tablets (40 mg total) by mouth daily with breakfast.    Call the office with any questions or concerns. Xray chest in 3-4 weeks to be sure PNA has cleared  Eulis Foster, FNP 01/06/2020

## 2020-01-06 NOTE — Telephone Encounter (Signed)
Left voicemail informing patient to call our office and schedule a virtual appointment.

## 2020-01-27 ENCOUNTER — Telehealth: Payer: Self-pay

## 2020-01-27 DIAGNOSIS — Z8616 Personal history of COVID-19: Secondary | ICD-10-CM

## 2020-01-27 NOTE — Telephone Encounter (Signed)
Left message with contact info on primary phone. Needs to schedule annual exam/CPE with PCP. Established care on 11/21/2018.

## 2020-01-27 NOTE — Telephone Encounter (Signed)
Patient was seen by Padonda on 01/06/20 and advised to come back in 3-4 weeks for another chest xray.  Can you order this? Please advise.  Thanks.  Dm/cma

## 2020-01-27 NOTE — Telephone Encounter (Signed)
Patient states that she was told to come in and get a chest X-ray after VV a few weeks ago. Does this need to be ordered, does she need a follow up in office? Please advise.

## 2020-01-28 NOTE — Addendum Note (Signed)
Addended by: Overton Mam on: 01/28/2020 09:15 AM   Modules accepted: Orders

## 2020-01-28 NOTE — Telephone Encounter (Signed)
cxr order placed. Pt can call to schedule lab appt

## 2020-01-28 NOTE — Telephone Encounter (Signed)
Pt notified and will call to schedule the appt to get the x-ray done. Dm/cma

## 2020-02-04 ENCOUNTER — Ambulatory Visit (INDEPENDENT_AMBULATORY_CARE_PROVIDER_SITE_OTHER): Payer: 59

## 2020-02-04 ENCOUNTER — Other Ambulatory Visit: Payer: Self-pay

## 2020-02-04 ENCOUNTER — Other Ambulatory Visit: Payer: 59

## 2020-02-04 DIAGNOSIS — Z8616 Personal history of COVID-19: Secondary | ICD-10-CM | POA: Diagnosis not present

## 2020-02-10 ENCOUNTER — Telehealth: Payer: Self-pay | Admitting: Family Medicine

## 2020-02-10 NOTE — Telephone Encounter (Signed)
Called patient and advised that I would send message to provider to advise on chest xray results.  She is aware that it will be tomorrow before she hears anything back.   please review and advise.  Thanks. Dm/cma

## 2020-02-10 NOTE — Telephone Encounter (Signed)
Patient would like call with chest xray results from 02/04/20.

## 2020-02-10 NOTE — Telephone Encounter (Signed)
lft VM to rtn call. Dm/cma  

## 2020-02-10 NOTE — Telephone Encounter (Signed)
CXR shows a resolving pneumonia so that is good. Cough will likely improve slowly with time. If not, pt should schedule appt to be seen

## 2020-02-11 NOTE — Telephone Encounter (Signed)
lft VM to rtn call. Dm/cma  

## 2020-02-12 NOTE — Telephone Encounter (Signed)
Patient notified VIA phone. No questions at this time.  Dm/cma

## 2020-02-13 ENCOUNTER — Telehealth (INDEPENDENT_AMBULATORY_CARE_PROVIDER_SITE_OTHER): Payer: 59 | Admitting: Family Medicine

## 2020-02-13 ENCOUNTER — Encounter: Payer: Self-pay | Admitting: Family Medicine

## 2020-02-13 VITALS — Ht 67.0 in | Wt 205.0 lb

## 2020-02-13 DIAGNOSIS — F32A Depression, unspecified: Secondary | ICD-10-CM

## 2020-02-13 DIAGNOSIS — F419 Anxiety disorder, unspecified: Secondary | ICD-10-CM | POA: Insufficient documentation

## 2020-02-13 DIAGNOSIS — Z8759 Personal history of other complications of pregnancy, childbirth and the puerperium: Secondary | ICD-10-CM

## 2020-02-13 DIAGNOSIS — J302 Other seasonal allergic rhinitis: Secondary | ICD-10-CM | POA: Diagnosis not present

## 2020-02-13 MED ORDER — SERTRALINE HCL 50 MG PO TABS
50.0000 mg | ORAL_TABLET | Freq: Every day | ORAL | 3 refills | Status: AC
Start: 2020-02-13 — End: ?

## 2020-02-13 NOTE — Progress Notes (Signed)
Virtual Visit via Video Note  I connected with Cindy Krueger on 02/13/20 at 11:30 AM EDT by a video enabled telemedicine application and verified that I am speaking with the correct person using two identifiers. Location patient: home Location provider: work  Persons participating in the virtual visit: patient, provider  I discussed the limitations of evaluation and management by telemedicine and the availability of in person appointments. The patient expressed understanding and agreed to proceed.  Chief Complaint  Patient presents with  . Follow-up    f/u med refill (Sertraline) and continue cough     HPI: Cindy Krueger is a 39 y.o. female who needs a refill of her sertraline 50mg  daily. She has been taking this for more than 1 year, feels it is effective, no side effects.  Pt also complains of nasal congestion, sniffling, throat clearing. No fever, chills. No sore throat. No SOB. Pt has not tried anything for this.   Past Medical History:  Diagnosis Date  . Anticoagulant long-term use    lovenox during pregnancy  . Anxiety   . Depression   . 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 Gallstones   . GERD (gastroesophageal reflux disease)   . Prothrombin gene mutation [facility])     Past Surgical History:  Procedure Laterality Date  . EXPLORATORY LAPAROTOMY LYSIS ADHESIONS FOR BOWEL OBSTRUCTION  2014  . LAPAROSCOPIC GASTRIC BANDING  2011  . LAPAROSCOPY CONVERTED TO OPEN GASTRIC BAND PORT REPOSITION  2019    Family History  Problem Relation Age of Onset  . Heart disease Mother   . Hypertension Mother   . Rheum arthritis Mother   . Cancer Father        prostate    Social History   Tobacco Use  . Smoking status: Former Smoker    Years: 0.50    Types: Cigarettes    Quit date: 07/28/1997    Years since quitting: 22.5  . Smokeless tobacco: Never Used  Vaping Use  . Vaping Use: Never used  Substance Use Topics  . Alcohol use: Yes    Comment:  socially  . Drug use: Never     Current Outpatient Medications:  .  aspirin EC 81 MG tablet, Take 81 mg by mouth daily. Swallow whole., Disp: , Rfl:  .  Calcium Carb-Cholecalciferol (CALCIUM 1000 + D) 1000-800 MG-UNIT TABS, every day, Disp: , Rfl:  .  calcium carbonate (TUMS - DOSED IN MG ELEMENTAL CALCIUM) 500 MG chewable tablet, Chew 1 tablet by mouth as needed for indigestion or heartburn., Disp: , Rfl:  .  Cholecalciferol (VITAMIN D3) 125 MCG (5000 UT) TABS, Take by mouth., Disp: , Rfl:  .  folic acid (FOLVITE) 1 MG tablet, Take 1 mg by mouth daily., Disp: , Rfl:  .  ibuprofen (ADVIL) 600 MG tablet, Take 1 tablet (600 mg total) by mouth every 6 (six) hours., Disp: 30 tablet, Rfl: 0 .  Magnesium 100 MG CAPS, every day, Disp: , Rfl:  .  Multiple Vitamin (MULTIVITAMIN) capsule, Take by mouth., Disp: , Rfl:  .  omega-3 acid ethyl esters (LOVAZA) 1 g capsule, Take by mouth 2 (two) times daily., Disp: , Rfl:  .  Saccharomyces boulardii (PROBIOTIC) 250 MG CAPS, Take by mouth., Disp: , Rfl:  .  sertraline (ZOLOFT) 50 MG tablet, Take 1 tablet (50 mg total) by mouth daily., Disp: 90 tablet, Rfl: 3  No Known Allergies    ROS: See pertinent positives  and negatives per HPI.   EXAM:  VITALS per patient if applicable: Ht 5\' 7"  (1.702 m)   Wt 205 lb (93 kg) Comment: pt reported  BMI 32.11 kg/m    GENERAL: alert, oriented, appears well and in no acute distress  HEENT: atraumatic, conjunctiva clear, no obvious abnormalities on inspection of external nose and ears  NECK: normal movements of the head and neck  LUNGS: on inspection no signs of respiratory distress, breathing rate appears normal, no obvious gross SOB, gasping or wheezing, no conversational dyspnea  CV: no obvious cyanosis  PSYCH/NEURO: pleasant and cooperative, no obvious depression or anxiety, speech and thought processing grossly intact   ASSESSMENT AND PLAN:  1. History of intrauterine fetal death 2. Depression,  unspecified depression type 3. Anxiety - well-controlled, stable; no side effects Refill: - sertraline (ZOLOFT) 50 MG tablet; Take 1 tablet (50 mg total) by mouth daily.  Dispense: 90 tablet; Refill: 3  4. Seasonal allergies - flonase daily - zyrtec daily - can also use nasal saline spray a few times per day as needed   I discussed the assessment and treatment plan with the patient. The patient was provided an opportunity to ask questions and all were answered. The patient agreed with the plan and demonstrated an understanding of the instructions.   The patient was advised to call back or seek an in-person evaluation if the symptoms worsen or if the condition fails to improve as anticipated.   , DO

## 2021-03-31 IMAGING — DX DG CHEST 2V
2 series · 2 of 2 positions shown · non-contrast
Comparison: Chest radiograph dated 01/02/2020.

CLINICAL DATA: History of COVID pneumonia 1 month ago. Occasional
productive cough remains.

EXAM:
CHEST - 2 VIEW

[chest pa]
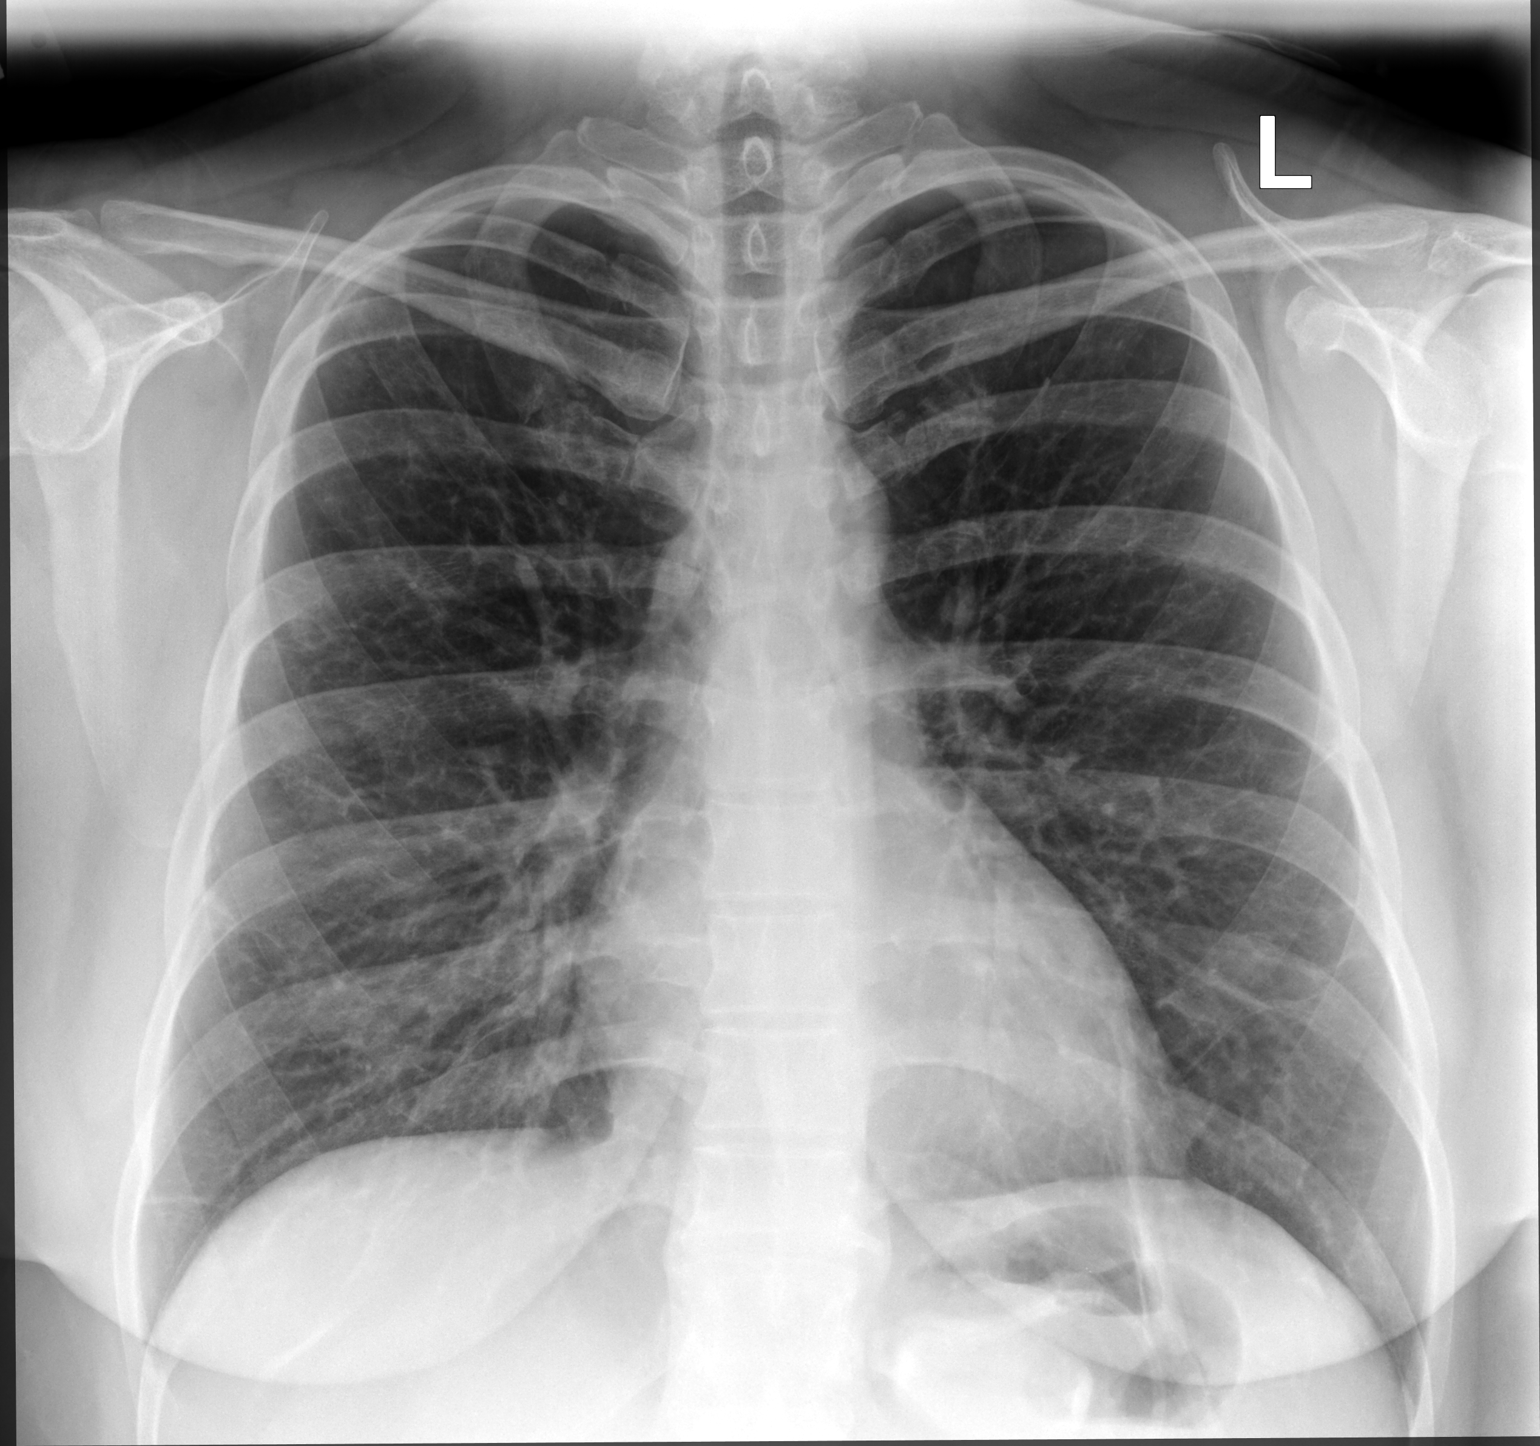

[chest lat]
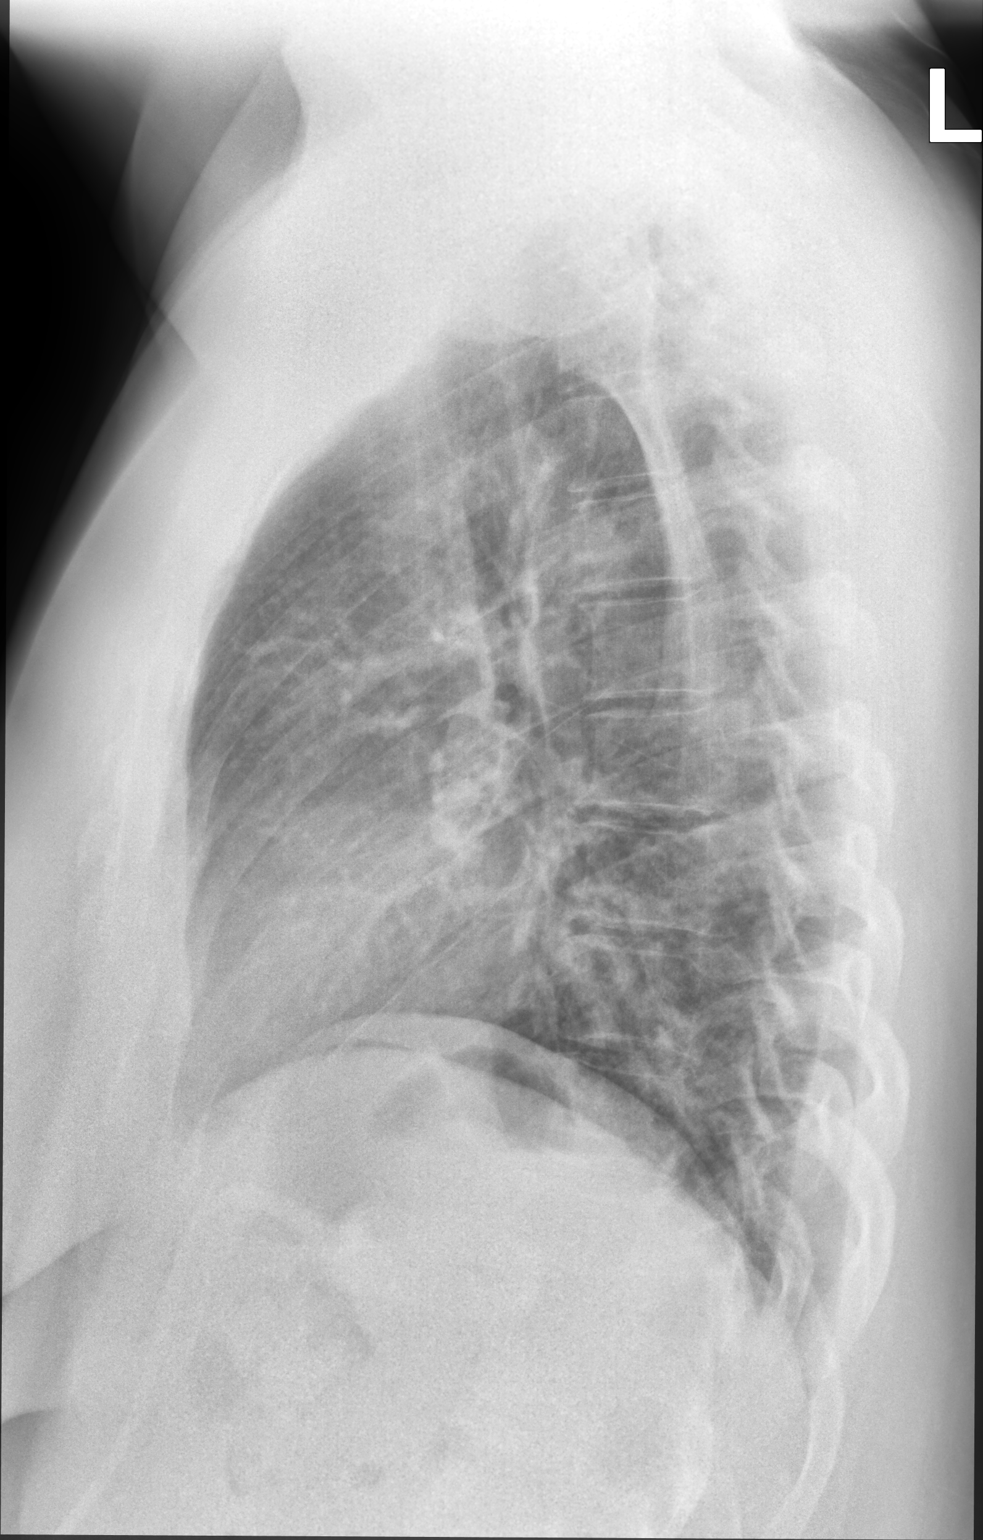

[2 of 2 positions shown; findings below may reference images not displayed]

FINDINGS: The heart size and mediastinal contours are within normal limits.
Minimal bibasilar atelectasis/scarring appear similar to prior exam.
Mild bibasilar airspace opacities have decreased. There is no
pleural effusion or pneumothorax. The visualized skeletal structures
are unremarkable.
IMPRESSION: Decreased mild bibasilar airspace opacities consistent with
resolving pneumonia.
